# Patient Record
Sex: Male | Born: 1965 | Race: White | Hispanic: No | Marital: Single | Smoking: Heavy tobacco smoker
Health system: Southern US, Community
[De-identification: ages and names within clinical notes are randomized; demographics above are authoritative.]

## PROBLEM LIST (undated history)

## (undated) DIAGNOSIS — I10 Essential (primary) hypertension: Secondary | ICD-10-CM

## (undated) DIAGNOSIS — F32A Depression, unspecified: Secondary | ICD-10-CM

## (undated) DIAGNOSIS — F319 Bipolar disorder, unspecified: Secondary | ICD-10-CM

## (undated) DIAGNOSIS — S069XAA Unspecified intracranial injury with loss of consciousness status unknown, initial encounter: Secondary | ICD-10-CM

## (undated) DIAGNOSIS — F419 Anxiety disorder, unspecified: Secondary | ICD-10-CM

## (undated) DIAGNOSIS — F200 Paranoid schizophrenia: Secondary | ICD-10-CM

## (undated) DIAGNOSIS — F329 Major depressive disorder, single episode, unspecified: Secondary | ICD-10-CM

## (undated) DIAGNOSIS — F4310 Post-traumatic stress disorder, unspecified: Secondary | ICD-10-CM

## (undated) DIAGNOSIS — S069X9A Unspecified intracranial injury with loss of consciousness of unspecified duration, initial encounter: Secondary | ICD-10-CM

---

## 2008-11-25 ENCOUNTER — Emergency Department: Payer: Self-pay | Admitting: Emergency Medicine

## 2010-03-12 ENCOUNTER — Emergency Department: Payer: Self-pay

## 2012-08-28 ENCOUNTER — Emergency Department: Payer: Self-pay | Admitting: Emergency Medicine

## 2012-08-28 LAB — URINALYSIS, COMPLETE
Bilirubin,UR: NEGATIVE
Blood: NEGATIVE
Leukocyte Esterase: NEGATIVE
Nitrite: NEGATIVE
Ph: 5 (ref 4.5–8.0)
Protein: NEGATIVE
RBC,UR: 1 /HPF (ref 0–5)

## 2012-08-28 LAB — COMPREHENSIVE METABOLIC PANEL
Albumin: 4.2 g/dL (ref 3.4–5.0)
Alkaline Phosphatase: 109 U/L (ref 50–136)
BUN: 8 mg/dL (ref 7–18)
Chloride: 109 mmol/L — ABNORMAL HIGH (ref 98–107)
Co2: 25 mmol/L (ref 21–32)
Creatinine: 0.76 mg/dL (ref 0.60–1.30)
EGFR (African American): >60
EGFR (Non-African Amer.): >60
Glucose: 103 mg/dL — ABNORMAL HIGH (ref 65–99)
SGOT(AST): 59 U/L — ABNORMAL HIGH (ref 15–37)
SGPT (ALT): 36 U/L (ref 12–78)
Total Protein: 8.3 g/dL — ABNORMAL HIGH (ref 6.4–8.2)

## 2012-08-28 LAB — ETHANOL: Ethanol: 300 mg/dL

## 2012-08-28 LAB — CBC
HCT: 43.9 % (ref 40.0–52.0)
HGB: 14.5 g/dL (ref 13.0–18.0)
MCH: 32.1 pg (ref 26.0–34.0)
MCV: 97 fL (ref 80–100)
RBC: 4.51 10*6/uL (ref 4.40–5.90)
RDW: 14 % (ref 11.5–14.5)
WBC: 17.2 10*3/uL — ABNORMAL HIGH (ref 3.8–10.6)

## 2012-12-31 LAB — CBC
HGB: 16.3 g/dL (ref 13.0–18.0)
MCH: 33.4 pg (ref 26.0–34.0)
MCHC: 34.3 g/dL (ref 32.0–36.0)
MCV: 97 fL (ref 80–100)
Platelet: 346 10*3/uL (ref 150–440)
RBC: 4.88 10*6/uL (ref 4.40–5.90)
RDW: 13.3 % (ref 11.5–14.5)
WBC: 8.8 10*3/uL (ref 3.8–10.6)

## 2012-12-31 LAB — COMPREHENSIVE METABOLIC PANEL
Albumin: 4.2 g/dL (ref 3.4–5.0)
Creatinine: 1 mg/dL (ref 0.60–1.30)
EGFR (African American): >60
EGFR (Non-African Amer.): >60
Potassium: 3.9 mmol/L (ref 3.5–5.1)
SGPT (ALT): 48 U/L (ref 12–78)
Total Protein: 8.2 g/dL (ref 6.4–8.2)

## 2012-12-31 LAB — TSH: Thyroid Stimulating Horm: 1.46 u[IU]/mL

## 2012-12-31 LAB — ETHANOL
Ethanol %: 0.348 % (ref 0.000–0.080)
Ethanol: 348 mg/dL

## 2013-01-01 ENCOUNTER — Inpatient Hospital Stay: Payer: Self-pay | Admitting: Psychiatry

## 2013-01-01 LAB — DRUG SCREEN, URINE
Amphetamines, Ur Screen: NEGATIVE (ref ?–1000)
Barbiturates, Ur Screen: NEGATIVE (ref ?–200)
MDMA (Ecstasy)Ur Screen: NEGATIVE (ref ?–500)
Opiate, Ur Screen: NEGATIVE (ref ?–300)
Phencyclidine (PCP) Ur S: NEGATIVE (ref ?–25)
Tricyclic, Ur Screen: NEGATIVE (ref ?–1000)

## 2013-01-01 LAB — URINALYSIS, COMPLETE
Bacteria: NONE SEEN
Blood: NEGATIVE
Ketone: NEGATIVE
Leukocyte Esterase: NEGATIVE
Nitrite: NEGATIVE
Ph: 6 (ref 4.5–8.0)
Protein: NEGATIVE
Specific Gravity: 1.006 (ref 1.003–1.030)
Squamous Epithelial: <1

## 2013-04-08 ENCOUNTER — Emergency Department: Payer: Self-pay | Admitting: Emergency Medicine

## 2013-04-08 LAB — COMPREHENSIVE METABOLIC PANEL
Alkaline Phosphatase: 84 U/L (ref 50–136)
Bilirubin,Total: 0.6 mg/dL (ref 0.2–1.0)
Calcium, Total: 8.4 mg/dL — ABNORMAL LOW (ref 8.5–10.1)
Chloride: 102 mmol/L (ref 98–107)
Co2: 26 mmol/L (ref 21–32)
Creatinine: 0.93 mg/dL (ref 0.60–1.30)
EGFR (Non-African Amer.): >60
Glucose: 127 mg/dL — ABNORMAL HIGH (ref 65–99)
Potassium: 3.6 mmol/L (ref 3.5–5.1)
SGOT(AST): 58 U/L — ABNORMAL HIGH (ref 15–37)
SGPT (ALT): 30 U/L (ref 12–78)
Sodium: 135 mmol/L — ABNORMAL LOW (ref 136–145)

## 2013-04-08 LAB — CBC
HCT: 41.9 % (ref 40.0–52.0)
MCHC: 33.9 g/dL (ref 32.0–36.0)
MCV: 99 fL (ref 80–100)
Platelet: 284 10*3/uL (ref 150–440)
RBC: 4.22 10*6/uL — ABNORMAL LOW (ref 4.40–5.90)

## 2013-04-08 LAB — DRUG SCREEN, URINE
Amphetamines, Ur Screen: NEGATIVE (ref ?–1000)
Barbiturates, Ur Screen: NEGATIVE (ref ?–200)
Benzodiazepine, Ur Scrn: NEGATIVE (ref ?–200)
Cocaine Metabolite,Ur ~~LOC~~: NEGATIVE (ref ?–300)
Methadone, Ur Screen: NEGATIVE (ref ?–300)
Opiate, Ur Screen: POSITIVE (ref ?–300)
Phencyclidine (PCP) Ur S: NEGATIVE (ref ?–25)

## 2013-04-08 LAB — URINALYSIS, COMPLETE
Bacteria: NONE SEEN
Blood: NEGATIVE
Glucose,UR: NEGATIVE mg/dL (ref 0–75)
Ketone: NEGATIVE
Nitrite: NEGATIVE
Ph: 5 (ref 4.5–8.0)
RBC,UR: <1 /HPF (ref 0–5)
Specific Gravity: 1.005 (ref 1.003–1.030)
Squamous Epithelial: NONE SEEN

## 2013-04-08 LAB — ETHANOL: Ethanol: 234 mg/dL

## 2013-06-17 ENCOUNTER — Emergency Department: Payer: Self-pay | Admitting: Emergency Medicine

## 2013-06-17 LAB — COMPREHENSIVE METABOLIC PANEL
Albumin: 3.8 g/dL (ref 3.4–5.0)
Alkaline Phosphatase: 70 U/L (ref 50–136)
BUN: 12 mg/dL (ref 7–18)
Co2: 20 mmol/L — ABNORMAL LOW (ref 21–32)
Creatinine: 0.74 mg/dL (ref 0.60–1.30)
Glucose: 92 mg/dL (ref 65–99)
Potassium: 4.1 mmol/L (ref 3.5–5.1)
SGOT(AST): 42 U/L — ABNORMAL HIGH (ref 15–37)
SGPT (ALT): 29 U/L (ref 12–78)
Sodium: 137 mmol/L (ref 136–145)
Total Protein: 7.3 g/dL (ref 6.4–8.2)

## 2013-06-17 LAB — CBC
MCH: 34 pg (ref 26.0–34.0)
MCHC: 34.8 g/dL (ref 32.0–36.0)
MCV: 98 fL (ref 80–100)
Platelet: 372 10*3/uL (ref 150–440)

## 2013-06-17 LAB — ETHANOL
Ethanol %: 0.183 % — ABNORMAL HIGH (ref 0.000–0.080)
Ethanol: 183 mg/dL

## 2013-06-17 LAB — DRUG SCREEN, URINE
Barbiturates, Ur Screen: NEGATIVE (ref ?–200)
Cannabinoid 50 Ng, Ur ~~LOC~~: POSITIVE (ref ?–50)
MDMA (Ecstasy)Ur Screen: NEGATIVE (ref ?–500)
Methadone, Ur Screen: NEGATIVE (ref ?–300)
Phencyclidine (PCP) Ur S: NEGATIVE (ref ?–25)

## 2013-06-17 LAB — ACETAMINOPHEN LEVEL: Acetaminophen: <2 ug/mL

## 2014-04-05 ENCOUNTER — Emergency Department: Payer: Self-pay | Admitting: Emergency Medicine

## 2014-11-18 ENCOUNTER — Emergency Department: Payer: Self-pay | Admitting: Internal Medicine
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
## 2017-01-09 ENCOUNTER — Other Ambulatory Visit: Payer: Self-pay | Admitting: Obstetrics and Gynecology

## 2017-01-09 ENCOUNTER — Ambulatory Visit
Admission: RE | Admit: 2017-01-09 | Discharge: 2017-01-09 | Disposition: A | Discharge status: Home / Self Care | Payer: Disability Insurance | Source: Ambulatory Visit | Attending: Obstetrics and Gynecology | Admitting: Obstetrics and Gynecology

## 2017-01-09 DIAGNOSIS — S2239XA Fracture of one rib, unspecified side, initial encounter for closed fracture: Secondary | ICD-10-CM | POA: Insufficient documentation

## 2017-01-09 DIAGNOSIS — M75101 Unspecified rotator cuff tear or rupture of right shoulder, not specified as traumatic: Secondary | ICD-10-CM

## 2017-01-09 DIAGNOSIS — S27309A Unspecified injury of lung, unspecified, initial encounter: Secondary | ICD-10-CM | POA: Insufficient documentation

## 2017-01-09 DIAGNOSIS — M4186 Other forms of scoliosis, lumbar region: Secondary | ICD-10-CM | POA: Diagnosis not present

## 2017-01-09 DIAGNOSIS — I700 Atherosclerosis of aorta: Secondary | ICD-10-CM | POA: Insufficient documentation

## 2017-01-09 DIAGNOSIS — X58XXXA Exposure to other specified factors, initial encounter: Secondary | ICD-10-CM | POA: Insufficient documentation

## 2017-01-09 DIAGNOSIS — M5136 Other intervertebral disc degeneration, lumbar region: Secondary | ICD-10-CM | POA: Diagnosis not present

## 2017-11-13 ENCOUNTER — Encounter: Payer: Self-pay | Admitting: Emergency Medicine

## 2017-11-13 ENCOUNTER — Emergency Department
Admission: EM | Admit: 2017-11-13 | Discharge: 2017-11-14 | Disposition: A | Discharge status: Home / Self Care | Payer: Medicaid Other | Attending: Emergency Medicine | Admitting: Emergency Medicine

## 2017-11-13 ENCOUNTER — Other Ambulatory Visit: Payer: Self-pay

## 2017-11-13 DIAGNOSIS — F4310 Post-traumatic stress disorder, unspecified: Secondary | ICD-10-CM | POA: Diagnosis not present

## 2017-11-13 DIAGNOSIS — F419 Anxiety disorder, unspecified: Secondary | ICD-10-CM | POA: Insufficient documentation

## 2017-11-13 DIAGNOSIS — F329 Major depressive disorder, single episode, unspecified: Secondary | ICD-10-CM | POA: Diagnosis not present

## 2017-11-13 HISTORY — DX: Anxiety disorder, unspecified: F41.9

## 2017-11-13 HISTORY — DX: Depression, unspecified: F32.A

## 2017-11-13 HISTORY — DX: Major depressive disorder, single episode, unspecified: F32.9

## 2017-11-13 LAB — COMPREHENSIVE METABOLIC PANEL
ALT: 25 U/L (ref 17–63)
ANION GAP: 11 (ref 5–15)
AST: 37 U/L (ref 15–41)
Albumin: 4.8 g/dL (ref 3.5–5.0)
Alkaline Phosphatase: 61 U/L (ref 38–126)
BUN: 13 mg/dL (ref 6–20)
CHLORIDE: 107 mmol/L (ref 101–111)
CO2: 23 mmol/L (ref 22–32)
Calcium: 9.2 mg/dL (ref 8.9–10.3)
Creatinine, Ser: 0.83 mg/dL (ref 0.61–1.24)
Glucose, Bld: 94 mg/dL (ref 65–99)
POTASSIUM: 4.3 mmol/L (ref 3.5–5.1)
Sodium: 141 mmol/L (ref 135–145)
Total Bilirubin: 0.8 mg/dL (ref 0.3–1.2)
Total Protein: 8 g/dL (ref 6.5–8.1)

## 2017-11-13 LAB — CBC
HCT: 41.1 % (ref 40.0–52.0)
Hemoglobin: 14.1 g/dL (ref 13.0–18.0)
MCH: 34 pg (ref 26.0–34.0)
MCHC: 34.3 g/dL (ref 32.0–36.0)
MCV: 99.2 fL (ref 80.0–100.0)
PLATELETS: 406 10*3/uL (ref 150–440)
RBC: 4.14 MIL/uL — ABNORMAL LOW (ref 4.40–5.90)
RDW: 13.1 % (ref 11.5–14.5)
WBC: 6.1 10*3/uL (ref 3.8–10.6)

## 2017-11-13 LAB — URINE DRUG SCREEN, QUALITATIVE (ARMC ONLY)
AMPHETAMINES, UR SCREEN: NOT DETECTED
Barbiturates, Ur Screen: NOT DETECTED
Benzodiazepine, Ur Scrn: NOT DETECTED
Cannabinoid 50 Ng, Ur ~~LOC~~: POSITIVE — AB
Cocaine Metabolite,Ur ~~LOC~~: POSITIVE — AB
MDMA (ECSTASY) UR SCREEN: NOT DETECTED
Methadone Scn, Ur: NOT DETECTED
Opiate, Ur Screen: NOT DETECTED
Phencyclidine (PCP) Ur S: NOT DETECTED
TRICYCLIC, UR SCREEN: NOT DETECTED

## 2017-11-13 LAB — ACETAMINOPHEN LEVEL

## 2017-11-13 LAB — ETHANOL: ALCOHOL ETHYL (B): 65 mg/dL — AB (ref <10)

## 2017-11-13 LAB — SALICYLATE LEVEL

## 2017-11-13 NOTE — Discharge Instructions (Signed)
You have been seen in the emergency department for a  psychiatric concern. You have been evaluated both medically as well as psychiatrically. Please follow-up with your outpatient resources provided. Return to the emergency department for any worsening symptoms, or any thoughts of hurting yourself or anyone else so that we may attempt to help you. 
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
## 2017-11-13 NOTE — ED Notes (Signed)
Patient assigned to appropriate care area. Patient oriented to unit/care area: Informed that, for their safety, care areas are designed for safety and monitored by security cameras at all times; and visiting hours explained to patient. Patient verbalizes understanding, and verbal contract for safety obtained. 
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
## 2017-11-13 NOTE — ED Notes (Signed)
SOC MD called to get report. Report was given, consult is to be conducted.

## 2017-11-13 NOTE — ED Notes (Signed)
Pt was given a snack tray and a milk per request. Pt sitting in bed calm and cooperative with no needs from staff

## 2017-11-15 ENCOUNTER — Other Ambulatory Visit: Payer: Self-pay

## 2017-11-15 ENCOUNTER — Emergency Department
Admission: EM | Admit: 2017-11-15 | Discharge: 2017-11-15 | Disposition: A | Discharge status: Other Acute Inpatient Hospital | Payer: Medicaid Other | Attending: Emergency Medicine | Admitting: Emergency Medicine

## 2017-11-15 ENCOUNTER — Inpatient Hospital Stay
Admission: AD | Admit: 2017-11-15 | Discharge: 2017-11-20 | DRG: 882 | Disposition: A | Discharge status: Home / Self Care | Payer: No Typology Code available for payment source | Attending: Psychiatry | Admitting: Psychiatry

## 2017-11-15 ENCOUNTER — Encounter: Payer: Self-pay | Admitting: *Deleted

## 2017-11-15 DIAGNOSIS — F1420 Cocaine dependence, uncomplicated: Secondary | ICD-10-CM | POA: Diagnosis present

## 2017-11-15 DIAGNOSIS — Z560 Unemployment, unspecified: Secondary | ICD-10-CM

## 2017-11-15 DIAGNOSIS — R45850 Homicidal ideations: Secondary | ICD-10-CM | POA: Diagnosis not present

## 2017-11-15 DIAGNOSIS — R440 Auditory hallucinations: Secondary | ICD-10-CM | POA: Diagnosis present

## 2017-11-15 DIAGNOSIS — F4310 Post-traumatic stress disorder, unspecified: Secondary | ICD-10-CM

## 2017-11-15 DIAGNOSIS — F329 Major depressive disorder, single episode, unspecified: Secondary | ICD-10-CM | POA: Diagnosis present

## 2017-11-15 DIAGNOSIS — Z818 Family history of other mental and behavioral disorders: Secondary | ICD-10-CM

## 2017-11-15 DIAGNOSIS — F1010 Alcohol abuse, uncomplicated: Secondary | ICD-10-CM | POA: Diagnosis not present

## 2017-11-15 DIAGNOSIS — F1410 Cocaine abuse, uncomplicated: Secondary | ICD-10-CM | POA: Diagnosis not present

## 2017-11-15 DIAGNOSIS — F1210 Cannabis abuse, uncomplicated: Secondary | ICD-10-CM | POA: Diagnosis not present

## 2017-11-15 DIAGNOSIS — Z23 Encounter for immunization: Secondary | ICD-10-CM | POA: Diagnosis not present

## 2017-11-15 DIAGNOSIS — F1220 Cannabis dependence, uncomplicated: Secondary | ICD-10-CM

## 2017-11-15 DIAGNOSIS — F17210 Nicotine dependence, cigarettes, uncomplicated: Secondary | ICD-10-CM | POA: Diagnosis not present

## 2017-11-15 DIAGNOSIS — F1020 Alcohol dependence, uncomplicated: Secondary | ICD-10-CM | POA: Diagnosis present

## 2017-11-15 DIAGNOSIS — F17200 Nicotine dependence, unspecified, uncomplicated: Secondary | ICD-10-CM | POA: Diagnosis present

## 2017-11-15 LAB — URINE DRUG SCREEN, QUALITATIVE (ARMC ONLY)
Amphetamines, Ur Screen: NOT DETECTED
BARBITURATES, UR SCREEN: NOT DETECTED
Benzodiazepine, Ur Scrn: NOT DETECTED
COCAINE METABOLITE, UR ~~LOC~~: POSITIVE — AB
Cannabinoid 50 Ng, Ur ~~LOC~~: POSITIVE — AB
MDMA (Ecstasy)Ur Screen: NOT DETECTED
METHADONE SCREEN, URINE: NOT DETECTED
Opiate, Ur Screen: NOT DETECTED
PHENCYCLIDINE (PCP) UR S: NOT DETECTED
Tricyclic, Ur Screen: NOT DETECTED

## 2017-11-15 LAB — COMPREHENSIVE METABOLIC PANEL
ALBUMIN: 4.9 g/dL (ref 3.5–5.0)
ALK PHOS: 68 U/L (ref 38–126)
ALT: 26 U/L (ref 17–63)
ANION GAP: 9 (ref 5–15)
AST: 41 U/L (ref 15–41)
BILIRUBIN TOTAL: 1.1 mg/dL (ref 0.3–1.2)
BUN: 16 mg/dL (ref 6–20)
CALCIUM: 9.5 mg/dL (ref 8.9–10.3)
CO2: 27 mmol/L (ref 22–32)
CREATININE: 0.94 mg/dL (ref 0.61–1.24)
Chloride: 103 mmol/L (ref 101–111)
GFR calc Af Amer: >60 mL/min (ref >60)
GFR calc non Af Amer: >60 mL/min (ref >60)
GLUCOSE: 96 mg/dL (ref 65–99)
Potassium: 4.9 mmol/L (ref 3.5–5.1)
SODIUM: 139 mmol/L (ref 135–145)
TOTAL PROTEIN: 8.5 g/dL — AB (ref 6.5–8.1)

## 2017-11-15 LAB — CBC
HCT: 42.7 % (ref 40.0–52.0)
HEMOGLOBIN: 14.5 g/dL (ref 13.0–18.0)
MCH: 34.1 pg — ABNORMAL HIGH (ref 26.0–34.0)
MCHC: 34.1 g/dL (ref 32.0–36.0)
MCV: 100.1 fL — ABNORMAL HIGH (ref 80.0–100.0)
Platelets: 437 10*3/uL (ref 150–440)
RBC: 4.27 MIL/uL — ABNORMAL LOW (ref 4.40–5.90)
RDW: 13.1 % (ref 11.5–14.5)
WBC: 8.2 10*3/uL (ref 3.8–10.6)

## 2017-11-15 LAB — ACETAMINOPHEN LEVEL

## 2017-11-15 LAB — ETHANOL: Alcohol, Ethyl (B): <10 mg/dL (ref <10)

## 2017-11-15 LAB — SALICYLATE LEVEL: Salicylate Lvl: <7 mg/dL (ref 2.8–30.0)

## 2017-11-15 MED ORDER — FLUOXETINE HCL 20 MG PO CAPS
20.0000 mg | ORAL_CAPSULE | Freq: Every day | ORAL | Status: DC
  Administered 2017-11-15: 20 mg via ORAL
  Filled 2017-11-15: qty 1

## 2017-11-15 MED ORDER — HYDROXYZINE HCL 25 MG PO TABS
50.0000 mg | ORAL_TABLET | Freq: Three times a day (TID) | ORAL | Status: DC | PRN
  Administered 2017-11-15: 50 mg via ORAL
  Filled 2017-11-15: qty 2

## 2017-11-15 MED ORDER — QUETIAPINE FUMARATE 25 MG PO TABS
100.0000 mg | ORAL_TABLET | Freq: Every day | ORAL | Status: DC
  Administered 2017-11-15: 100 mg via ORAL
  Filled 2017-11-15: qty 4
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
## 2017-11-15 NOTE — ED Notes (Signed)
Pt has wallet in pants pocket. No reported cash, belt, jeans, shirt, jacket, black shoes, socks, underwear, long johns and a black hat all placed in belongings bag with pt present. Bag taken to [facility] with pt.

## 2017-11-15 NOTE — ED Notes (Signed)
[facility] Patient agreeable with plan of care.

## 2017-11-15 NOTE — ED Notes (Signed)
Patient denied snack.

## 2017-11-15 NOTE — ED Notes (Signed)
Pt given supper tray.

## 2017-11-15 NOTE — ED Notes (Signed)
Pt. To BHU from ED ambulatory without difficulty, to room  [facility]. [facility]. Pt. Is alert and oriented, warm and dry in no distress. Pt. Denies SI. Pt states, "I am here because of homicidal thoughts and I am hearing voices saying they going to get me and I see shadows."  Pt. Calm and cooperative. Pt. Made aware of security cameras and Q15 minute rounds. Patient was given a sandwich tray and soda. Pt. Encouraged to let Nursing staff know of any concerns or needs.
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
## 2017-11-15 NOTE — ED Notes (Signed)

## 2017-11-15 NOTE — BH Assessment (Signed)
Contacted [facility] BMU re: placing pt on the unit. Staff Alex stated 3 other pts are being admitted on the unit and they are currently unsure if they would be unable to accept another pt; stated they would contact me later with an update.

## 2017-11-15 NOTE — ED Notes (Signed)
Pt. Currently sleeping in bed.  TTS and Pych consult completed.  SOC recommends inpatient treatment.

## 2017-11-16 ENCOUNTER — Other Ambulatory Visit: Payer: Self-pay

## 2017-11-16 DIAGNOSIS — F17200 Nicotine dependence, unspecified, uncomplicated: Secondary | ICD-10-CM | POA: Diagnosis present

## 2017-11-16 LAB — HEMOGLOBIN A1C
Hgb A1c MFr Bld: 5.5 % (ref 4.8–5.6)
MEAN PLASMA GLUCOSE: 111.15 mg/dL

## 2017-11-16 LAB — LIPID PANEL
CHOL/HDL RATIO: 3.8 ratio
CHOLESTEROL: 178 mg/dL (ref 0–200)
HDL: 47 mg/dL (ref >40)
LDL Cholesterol: 91 mg/dL (ref 0–99)
Triglycerides: 200 mg/dL — ABNORMAL HIGH (ref <150)
VLDL: 40 mg/dL (ref 0–40)

## 2017-11-16 LAB — TSH: TSH: 1.517 u[IU]/mL (ref 0.350–4.500)

## 2017-11-16 MED ORDER — FLUOXETINE HCL 20 MG PO CAPS
20.0000 mg | ORAL_CAPSULE | Freq: Every day | ORAL | Status: DC
  Administered 2017-11-16 – 2017-11-19 (×4): 20 mg via ORAL
  Filled 2017-11-16 (×4): qty 1

## 2017-11-16 MED ORDER — ALUM & MAG HYDROXIDE-SIMETH 200-200-20 MG/5ML PO SUSP: 30.0000 mL | ORAL | Status: DC | PRN

## 2017-11-16 MED ORDER — PRAZOSIN HCL 2 MG PO CAPS
2.0000 mg | ORAL_CAPSULE | Freq: Two times a day (BID) | ORAL | Status: DC
  Administered 2017-11-17 – 2017-11-20 (×7): 2 mg via ORAL
  Filled 2017-11-16 (×7): qty 1

## 2017-11-16 MED ORDER — QUETIAPINE FUMARATE 200 MG PO TABS
300.0000 mg | ORAL_TABLET | Freq: Every day | ORAL | Status: DC
  Administered 2017-11-16 – 2017-11-19 (×4): 300 mg via ORAL
  Filled 2017-11-16 (×3): qty 1

## 2017-11-16 MED ORDER — ACETAMINOPHEN 325 MG PO TABS
650.0000 mg | ORAL_TABLET | Freq: Four times a day (QID) | ORAL | Status: DC | PRN
  Administered 2017-11-16 – 2017-11-20 (×6): 650 mg via ORAL
  Filled 2017-11-16 (×7): qty 2

## 2017-11-16 MED ORDER — TRAZODONE HCL 100 MG PO TABS
100.0000 mg | ORAL_TABLET | Freq: Every evening | ORAL | Status: DC | PRN
  Administered 2017-11-16: 100 mg via ORAL
  Filled 2017-11-16: qty 1

## 2017-11-16 MED ORDER — MAGNESIUM HYDROXIDE 400 MG/5ML PO SUSP: 30.0000 mL | Freq: Every day | ORAL | Status: DC | PRN

## 2017-11-16 MED ORDER — INFLUENZA VAC SPLIT QUAD 0.5 ML IM SUSY
0.5000 mL | PREFILLED_SYRINGE | INTRAMUSCULAR | Status: AC
  Administered 2017-11-17: 0.5 mL via INTRAMUSCULAR
  Filled 2017-11-16: qty 0.5

## 2017-11-16 MED ORDER — QUETIAPINE FUMARATE 100 MG PO TABS: 100.0000 mg | ORAL_TABLET | Freq: Every day | ORAL | Status: DC

## 2017-11-16 MED ORDER — PNEUMOCOCCAL VAC POLYVALENT 25 MCG/0.5ML IJ INJ
0.5000 mL | INJECTION | INTRAMUSCULAR | Status: AC
  Administered 2017-11-17: 0.5 mL via INTRAMUSCULAR
  Filled 2017-11-16: qty 0.5

## 2017-11-16 MED ORDER — HYDROXYZINE HCL 50 MG PO TABS
50.0000 mg | ORAL_TABLET | Freq: Three times a day (TID) | ORAL | Status: DC | PRN
  Administered 2017-11-16 – 2017-11-19 (×2): 50 mg via ORAL
  Filled 2017-11-16 (×2): qty 1

## 2017-11-16 MED ORDER — ADULT MULTIVITAMIN W/MINERALS CH
1.0000 | ORAL_TABLET | Freq: Every day | ORAL | Status: DC
  Administered 2017-11-16 – 2017-11-20 (×5): 1 via ORAL
  Filled 2017-11-16 (×5): qty 1

## 2017-11-16 MED ORDER — ENSURE ENLIVE PO LIQD
237.0000 mL | Freq: Three times a day (TID) | ORAL | Status: DC
  Administered 2017-11-16 – 2017-11-20 (×13): 237 mL via ORAL

## 2017-11-16 NOTE — Tx Team (Signed)
Initial Treatment Plan 11/16/2017 1:51 AM Bill Villanueva ZOX:[id number]    PATIENT STRESSORS: Health problems Medication change or noncompliance Substance abuse   PATIENT STRENGTHS: Ability for insight Capable of independent living Motivation for treatment/growth Special hobby/interest   PATIENT IDENTIFIED PROBLEMS:   To be free from anxiety and depression   " To start taking my medications everyday"                 DISCHARGE CRITERIA:  Ability to meet basic life and health needs Adequate post-discharge living arrangements Improved stabilization in mood, thinking, and/or behavior Motivation to continue treatment in a less acute level of care Reduction of life-threatening or endangering symptoms to within safe limits Verbal commitment to aftercare and medication compliance  PRELIMINARY DISCHARGE PLAN: Attend aftercare/continuing care group Outpatient therapy Return to previous living arrangement  PATIENT/FAMILY INVOLVEMENT: This treatment plan has been presented to and reviewed with the patient, Bill Villanueva, and/or family member,  The patient and family have been given the opportunity to ask questions and make suggestions.  Bill Oxfordharlotte Norvella Loscalzo, RN 11/16/2017, 1:51 AM
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
## 2017-11-16 NOTE — Progress Notes (Signed)
Patient slept 4 hours this shift, no acute distress noted.
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
## 2017-11-16 NOTE — Progress Notes (Signed)
The patient is admitted to room 316 with the diagnosis of PTSD. Alert and oriented x 4. The  patient is oriented to his room, staff and the unit. Patient verbalized he's depressed and anxious but denies SI/HI/AVH. Skin assessment done with Kathlene NovemberMike RN, noted abrasion on right forearm and multiple  Scattered tattoos. During admission questionnaire, he stated he's been off from his medication for so long and felt that triggered his emotions. He verbalized he's ready to be complaint with his medications. Tylenol 650 mg PRN administered for back pain,  Vistaril 50 mg PRN for anxiety, and Trazodone 100 mg for insomnia. He was offered snack before going to bed. Will continue Q 15 minutes checks and camera surveillance.

## 2017-11-16 NOTE — [facility] Suicide Risk Assessment (Signed)
[facility] INPATIENT:  Family/Significant Other Suicide Prevention Education  Suicide Prevention Education:  Patient Refusal for Family/Significant Other Suicide Prevention Education: The patient Blanca FriendLarry W Lucchesi has refused to provide written consent for family/significant other to be provided Family/Significant Other Suicide Prevention Education during admission and/or prior to discharge.  Physician notified.  Heidi DachKelsey Arlie Posch, LCSW 11/16/2017, 10:43 AM

## 2017-11-16 NOTE — Plan of Care (Signed)
  Progressing Nutrition: Adequate nutrition will be maintained 11/16/2017 0952 - Progressing by Elige Radonobb, Kendrik Mcshan B, RN Coping: Level of anxiety will decrease 11/16/2017 0952 - Progressing by Elige Radonobb, Birdia Jaycox B, RN Safety: Ability to remain free from injury will improve 11/16/2017 0952 - Progressing by Elige Radonobb, Raylinn Kosar B, RN Education: Knowledge of the prescribed therapeutic regimen will improve 11/16/2017 0952 - Progressing by Elige Radonobb, Balen Woolum B, RN Coping: Ability to cope will improve 11/16/2017 0952 - Progressing by Elige Radonobb, Shlome Baldree B, RN Ability to verbalize feelings will improve 11/16/2017 0952 - Progressing by Elige Radonobb, Antwyne Pingree B, RN Safety: Ability to disclose and discuss suicidal ideas will improve 11/16/2017 0952 - Progressing by Elige Radonobb, Jamelah Sitzer B, RN   Not Progressing Activity: Interest or engagement in leisure activities will improve 11/16/2017 0952 - Not Progressing by Elige Radonobb, Lowanda Cashaw B, RN   Not Applicable Clinical Measurements: Ability to maintain clinical measurements within normal limits will improve 11/16/2017 0952 - Not Applicable by Elige Radonobb, Paetyn Pietrzak B, RN Will remain free from infection 11/16/2017 [id number] - Not Applicable by Elige Radonobb, Aemon Koeller B, RN Diagnostic test results will improve 11/16/2017 [id number] - Not Applicable by Elige Radonobb, Lawernce Earll B, RN Respiratory complications will improve 11/16/2017 [id number] - Not Applicable by Elige Radonobb, Jamell Opfer B, RN Cardiovascular complication will be avoided 11/16/2017 [id number] - Not Applicable by Elige Radonobb, Wilhelmine Krogstad B, RN Skin Integrity: Risk for impaired skin integrity will decrease 11/16/2017 0952 - Not Applicable by Elige Radonobb, Ricki Vanhandel B, RN

## 2017-11-16 NOTE — Progress Notes (Signed)
Initial Nutrition Assessment  DOCUMENTATION CODES:   Underweight  INTERVENTION:   Ensure Enlive po TID, each supplement provides 350 kcal and 20 grams of protein  MVI daily   Recommend thiamine 100mg  po daily  Recommend folic acid 1mg  daily   NUTRITION DIAGNOSIS:   Increased nutrient needs related to social / environmental circumstances(substance abuse) as evidenced by increased estimated needs from protein, folic acid, and thiamine.  GOAL:   Patient will meet greater than or equal to 90% of their needs  MONITOR:   PO intake, Supplement acceptance  REASON FOR ASSESSMENT:   Malnutrition Screening Tool    ASSESSMENT:   52 year old man who was brought to the emergency room referred by [facility] today because of agitation and "homicidal ideation"   Pt with h/o etoh and drug abuse. Pt is currently eating 100% of meals. Per chart, pt is weight stable. RD suspects this pt may have some degree of malnutrition giving his substance abuse and pt is underweight. RD will order supplements and MVI. Recommend thiamine and folic acid r/t etoh abuse.   Medications reviewed  Labs reviewed  Diet Order:  Diet regular Room service appropriate? Yes; Fluid consistency: Thin  EDUCATION NEEDS:   Not appropriate for education at this time  Skin:  Reviewed RN Assessment  Last BM:  2/19  Height:   Ht Readings from Last 1 Encounters:  11/15/17 5\' 5"  (1.651 m)    Weight:   Wt Readings from Last 1 Encounters:  11/15/17 125 lb 8 oz (56.9 kg)    Ideal Body Weight:  61.8 kg  BMI:  Body mass index is 20.88 kg/m.  Estimated Nutritional Needs:   Kcal:  1700-2000kcal/day   Protein:  85-96g/day   Fluid:  >1.7L/day   Betsey Holidayasey Alese Furniss MS, RD, LDN Pager #[phone]- [phone] After Hours Pager: [phone]

## 2017-11-16 NOTE — [facility] Group Notes (Signed)
  11/16/2017 9am  Type of Therapy and Topic: Group Therapy: Goals Group: SMART Goals   Participation Level: Did Not Attend  Description of Group:    The purpose of a daily goals group is to assist and guide patients in setting recovery/wellness-related goals. The objective is to set goals as they relate to the crisis in which they were admitted. Patients will be using SMART goal modalities to set measurable goals. Characteristics of realistic goals will be discussed and patients will be assisted in setting and processing how one will reach their goal. Facilitator will also assist patients in applying interventions and coping skills learned in psycho-education groups to the SMART goal and process how one will achieve defined goal.   Therapeutic Goals:   -Patients will develop and document one goal related to or their crisis in which brought them into treatment.  -Patients will be guided by LCSW using SMART goal setting modality in how to set a measurable, attainable, realistic and time sensitive goal.  -Patients will process barriers in reaching goal.  -Patients will process interventions in how to overcome and successful in reaching goal.   Patient's Goal: Patient was encouraged and invited to attend group. Patient did not attend group. Social worker will continue to encourage group participation in the future.    Therapeutic Modalities:  Motivational Interviewing  Cognitive Behavioral Therapy  Crisis Intervention Model  SMART goals setting  Jacobus Colvin, LCSW 11/16/2017 9:43 AM   

## 2017-11-16 NOTE — [facility] Group Notes (Signed)
11/16/2017  Time: 1:00PM  Type of Therapy/Topic:  Group Therapy:  Balance in Life  Participation Level:  Did Not Attend  Description of Group:   This group will address the concept of balance and how it feels and looks when one is unbalanced. Patients will be encouraged to process areas in their lives that are out of balance and identify reasons for remaining unbalanced. Facilitators will guide patients in utilizing problem-solving interventions to address and correct the stressor making their life unbalanced. Understanding and applying boundaries will be explored and addressed for obtaining and maintaining a balanced life. Patients will be encouraged to explore ways to assertively make their unbalanced needs known to significant others in their lives, using other group members and facilitator for support and feedback.  Therapeutic Goals: 1. Patient will identify two or more emotions or situations they have that consume much of in their lives. 2. Patient will identify signs/triggers that life has become out of balance:  3. Patient will identify two ways to set boundaries in order to achieve balance in their lives:  4. Patient will demonstrate ability to communicate their needs through discussion and/or role plays  Summary of Patient Progress: Pt was invited to attend group but chose not to attend. CSW will continue to encourage pt to attend group throughout their admission.   Therapeutic Modalities:   Cognitive Behavioral Therapy Solution-Focused Therapy Assertiveness Training  Heidi DachKelsey Makaylia Hewett, MSW, LCSW 11/16/2017 1:55 PM

## 2017-11-16 NOTE — Progress Notes (Signed)
Patient affect constricted.  Verbalizes that he is starting to feel better.  Up for meals otherwise isolates to room.  Good appetite.  Medication compliant.  No group attendance.  Support and encouragement offered.  Safety rounds maintained.  Patient remains safe on the unit.
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
## 2017-11-17 MED ORDER — MEGESTROL ACETATE 400 MG/10ML PO SUSP
400.0000 mg | Freq: Every day | ORAL | Status: DC
  Administered 2017-11-17 – 2017-11-20 (×4): 400 mg via ORAL
  Filled 2017-11-17 (×4): qty 10

## 2017-11-17 MED ORDER — VITAMIN B-1 100 MG PO TABS
100.0000 mg | ORAL_TABLET | Freq: Every day | ORAL | Status: DC
  Administered 2017-11-17 – 2017-11-20 (×4): 100 mg via ORAL
  Filled 2017-11-17 (×4): qty 1

## 2017-11-17 MED ORDER — FOLIC ACID 1 MG PO TABS
1.0000 mg | ORAL_TABLET | Freq: Every day | ORAL | Status: DC
  Administered 2017-11-17 – 2017-11-20 (×4): 1 mg via ORAL
  Filled 2017-11-17 (×4): qty 1

## 2017-11-17 NOTE — Progress Notes (Signed)
D: Patient denies SI/HI/AVH. P Patient affect is sad. Mood is depressed.  Patient interaction with staff was minimal.  Patient guarded and forwards little. Patient visible on the milieu. No distress noted. A: Support and encouragement offered. Scheduled medications given to pt. Q 15 min checks continued for patient safety. R: Patient receptive. Patient remains safe on the unit.

## 2017-11-17 NOTE — Progress Notes (Signed)
CSW completed medication management application for pt in order for it to be faxed, with prescriptions, on Monday prior to pt's discharge. CSW will continue to support pt as needed throughout his admission.   Heidi DachKelsey Anagabriela Jokerst, MSW, LCSW 11/17/2017 12:21 PM

## 2017-11-17 NOTE — Tx Team (Signed)
Interdisciplinary Treatment and Diagnostic Plan Update  11/17/2017 Time of Session: [address] Bill Villanueva MRN: [id number]  Principal Diagnosis: PTSD (post-traumatic stress disorder)  Secondary Diagnoses: Principal Problem:   PTSD (post-traumatic stress disorder) Active Problems:   Cocaine use disorder, moderate, dependence (HCC)   Cannabis use disorder, moderate, dependence (HCC)   Tobacco use disorder   Current Medications:  Current Facility-Administered Medications  Medication Dose Route Frequency Provider Last Rate Last Dose  . acetaminophen (TYLENOL) tablet 650 mg  650 mg Oral Q6H PRN Villanueva, Bill Denmark, MD   650 mg at 11/16/17 1841  . alum & mag hydroxide-simeth (MAALOX/MYLANTA) 200-200-20 MG/5ML suspension 30 mL  30 mL Oral Q4H PRN Villanueva, Bill T, MD      . feeding supplement (ENSURE ENLIVE) (ENSURE ENLIVE) liquid 237 mL  237 mL Oral TID BM Villanueva, Bill Hutchinson, MD   237 mL at 11/16/17 2000  . FLUoxetine (PROZAC) capsule 20 mg  20 mg Oral Daily Villanueva, Bill Denmark, MD   20 mg at 11/17/17 0845  . folic acid (FOLVITE) tablet 1 mg  1 mg Oral Daily Villanueva, Bill B, MD   1 mg at 11/17/17 0845  . hydrOXYzine (ATARAX/VISTARIL) tablet 50 mg  50 mg Oral TID PRN Villanueva, Bill Denmark, MD   50 mg at 11/16/17 0054  . Influenza vac split quadrivalent PF (FLUARIX) injection 0.5 mL  0.5 mL Intramuscular Tomorrow-1000 Villanueva, Bill R, MD      . magnesium hydroxide (MILK OF MAGNESIA) suspension 30 mL  30 mL Oral Daily PRN Villanueva, Bill T, MD      . multivitamin with minerals tablet 1 tablet  1 tablet Oral Daily Villanueva, Bill Hutchinson, MD   1 tablet at 11/17/17 0844  . pneumococcal 23 valent vaccine (PNU-IMMUNE) injection 0.5 mL  0.5 mL Intramuscular Tomorrow-1000 Villanueva, Bill R, MD      . prazosin (MINIPRESS) capsule 2 mg  2 mg Oral BID Villanueva, Bill B, MD   2 mg at 11/17/17 0845  . QUEtiapine (SEROQUEL) tablet 300 mg  300 mg Oral QHS Villanueva, Bill B, MD   300 mg at 11/16/17 2104  . thiamine (VITAMIN B-1)  tablet 100 mg  100 mg Oral Daily Villanueva, Bill B, MD   100 mg at 11/17/17 0845   PTA Medications: No medications prior to admission.    Patient Stressors: Health problems Medication change or noncompliance Substance abuse  Patient Strengths: Ability for insight Capable of independent living Motivation for treatment/growth Special hobby/interest  Treatment Modalities: Medication Management, Group therapy, Case management,  1 to 1 session with clinician, Psychoeducation, Recreational therapy.   Physician Treatment Plan for Primary Diagnosis: PTSD (post-traumatic stress disorder) Long Term Goal(s): Improvement in symptoms so as ready for discharge Improvement in symptoms so as ready for discharge   Short Term Goals: Ability to identify changes in lifestyle to reduce recurrence of condition will improve Ability to verbalize feelings will improve Ability to disclose and discuss suicidal ideas Ability to demonstrate self-control will improve Ability to identify and develop effective coping behaviors will improve Ability to maintain clinical measurements within normal limits will improve Compliance with prescribed medications will improve Ability to identify triggers associated with substance abuse/mental health issues will improve Ability to identify changes in lifestyle to reduce recurrence of condition will improve Ability to demonstrate self-control will improve Ability to identify triggers associated with substance abuse/mental health issues will improve  Medication Management: Evaluate patient's response, side effects, and tolerance of medication regimen.  Therapeutic Interventions:  1 to 1 sessions, Unit Group sessions and Medication administration.  Evaluation of Outcomes: Progressing  Physician Treatment Plan for Secondary Diagnosis: Principal Problem:   PTSD (post-traumatic stress disorder) Active Problems:   Cocaine use disorder, moderate, dependence (HCC)    Cannabis use disorder, moderate, dependence (HCC)   Tobacco use disorder  Long Term Goal(s): Improvement in symptoms so as ready for discharge Improvement in symptoms so as ready for discharge   Short Term Goals: Ability to identify changes in lifestyle to reduce recurrence of condition will improve Ability to verbalize feelings will improve Ability to disclose and discuss suicidal ideas Ability to demonstrate self-control will improve Ability to identify and develop effective coping behaviors will improve Ability to maintain clinical measurements within normal limits will improve Compliance with prescribed medications will improve Ability to identify triggers associated with substance abuse/mental health issues will improve Ability to identify changes in lifestyle to reduce recurrence of condition will improve Ability to demonstrate self-control will improve Ability to identify triggers associated with substance abuse/mental health issues will improve     Medication Management: Evaluate patient's response, side effects, and tolerance of medication regimen.  Therapeutic Interventions: 1 to 1 sessions, Unit Group sessions and Medication administration.  Evaluation of Outcomes: Progressing   RN Treatment Plan for Primary Diagnosis: PTSD (post-traumatic stress disorder) Long Term Goal(s): Knowledge of disease and therapeutic regimen to maintain health will improve  Short Term Goals: Ability to verbalize feelings will improve, Ability to identify and develop effective coping behaviors will improve and Compliance with prescribed medications will improve  Medication Management: RN will administer medications as ordered by provider, will assess and evaluate patient's response and provide education to patient for prescribed medication. RN will report any adverse and/or side effects to prescribing provider.  Therapeutic Interventions: 1 on 1 counseling sessions, Psychoeducation, Medication  administration, Evaluate responses to treatment, Monitor vital signs and CBGs as ordered, Perform/monitor CIWA, COWS, AIMS and Fall Risk screenings as ordered, Perform wound care treatments as ordered.  Evaluation of Outcomes: Progressing   LCSW Treatment Plan for Primary Diagnosis: PTSD (post-traumatic stress disorder) Long Term Goal(s): Safe transition to appropriate next level of care at discharge, Engage patient in therapeutic group addressing interpersonal concerns.  Short Term Goals: Engage patient in aftercare planning with referrals and resources, Facilitate patient progression through stages of change regarding substance use diagnoses and concerns and Identify triggers associated with mental health/substance abuse issues  Therapeutic Interventions: Assess for all discharge needs, 1 to 1 time with Social worker, Explore available resources and support systems, Assess for adequacy in community support network, Educate family and significant other(s) on suicide prevention, Complete Psychosocial Assessment, Interpersonal group therapy.  Evaluation of Outcomes: Progressing   Progress in Treatment: Attending groups: Yes. Participating in groups: Yes. Taking medication as prescribed: Yes. Toleration medication: Yes. Family/Significant other contact made: No, will contact:  a support if pt provides consent. Patient understands diagnosis: Yes. Discussing patient identified problems/goals with staff: Yes. Medical problems stabilized or resolved: Yes. Denies suicidal/homicidal ideation: Yes. Issues/concerns per patient self-inventory: No. Other: None at this time.   New problem(s) identified: No, Describe:  None at this time.   New Short Term/Long Term Goal(s): Pt reported his goal for treatment is to, "get back on my medications so I can do what I need to do."   Discharge Plan or Barriers: Pt will discharge home and will continue treatment in the outpatient setting with [facility].   Reason  for Continuation of Hospitalization: Anxiety Depression Medication stabilization  Estimated Length of Stay: 3 days  Attendees: Patient: Bill Villanueva GeneralLarry Dinning 11/17/2017 11:10 AM  Physician: Dr. Jennet MaduroPucilowska, MD 11/17/2017 11:10 AM  Nursing:  11/17/2017 11:10 AM  RN Care Manager: 11/17/2017 11:10 AM  Social Worker: Heidi DachKelsey Janna Oak, LCSW 11/17/2017 11:10 AM  Recreational Therapist:  11/17/2017 11:10 AM  Other: Johny Shearsassandra Jarrett, LCSWA 11/17/2017 11:10 AM  Other:  11/17/2017 11:10 AM  Other: 11/17/2017 11:10 AM    Scribe for Treatment Team: Heidi DachKelsey Wiley Magan, LCSW 11/17/2017 11:10 AM

## 2017-11-17 NOTE — Plan of Care (Signed)
  Progressing Health Behavior/Discharge Planning: Ability to manage health-related needs will improve 11/17/2017 2013 - Progressing by Lelan PonsAriwodo, Bekah Igoe, RN Activity: Risk for activity intolerance will decrease 11/17/2017 2013 - Progressing by Lelan PonsAriwodo, Kathryn Cosby, RN Nutrition: Adequate nutrition will be maintained 11/17/2017 2013 - Progressing by Lelan PonsAriwodo, Gearlene Godsil, RN Coping: Level of anxiety will decrease 11/17/2017 2013 - Progressing by Lelan PonsAriwodo, Nancie Bocanegra, RN Pain Managment: General experience of comfort will improve 11/17/2017 2013 - Progressing by Lelan PonsAriwodo, Bryttani Blew, RN Safety: Ability to remain free from injury will improve 11/17/2017 2013 - Progressing by Lelan PonsAriwodo, Mickey Esguerra, RN Coping: Ability to verbalize feelings will improve 11/17/2017 2013 - Progressing by Lelan PonsAriwodo, Khristine Verno, RN

## 2017-11-17 NOTE — [facility] Group Notes (Signed)
LCSW Group Therapy Note  11/17/2017 1:00 pm  Type of Therapy and Topic:  Group Therapy:  Feelings around Relapse and Recovery  Participation Level:  Did Not Attend   Description of Group:    Patients in this group will discuss emotions they experience before and after a relapse. They will process how experiencing these feelings, or avoidance of experiencing them, relates to having a relapse. Facilitator will guide patients to explore emotions they have related to recovery. Patients will be encouraged to process which emotions are more powerful. They will be guided to discuss the emotional reaction significant others in their lives may have to their relapse or recovery. Patients will be assisted in exploring ways to respond to the emotions of others without this contributing to a relapse.  Therapeutic Goals: 1. Patient will identify two or more emotions that lead to a relapse for them 2. Patient will identify two emotions that result when they relapse 3. Patient will identify two emotions related to recovery 4. Patient will demonstrate ability to communicate their needs through discussion and/or role plays   Summary of Patient Progress:     Therapeutic Modalities:   Cognitive Behavioral Therapy Solution-Focused Therapy Assertiveness Training Relapse Prevention Therapy   Bill FrameSonya S Piercen Covino, LCSW 11/17/2017 3:16 PM

## 2017-11-17 NOTE — Plan of Care (Signed)
Patient is alert and oriented X 4. Patient denies SI and HI. Patient states his voices has not been bothering him in a few days. Patient received his influenza injection as well as pneumonia injection. Patient is eating meals regularly and is pleasant to staff and peers. Patient has no concerns or complaints today. Vitals  140/82; respiration 16; Temperature: 97.8. Nurse will continue to monitor. Education: Knowledge of General Education information will improve 11/17/2017 1311 - Progressing by Leamon ArntPowell, Gram Siedlecki K, RN   Health Behavior/Discharge Planning: Ability to manage health-related needs will improve 11/17/2017 1311 - Progressing by Leamon ArntPowell, Kade Demicco K, RN   Activity: Risk for activity intolerance will decrease 11/17/2017 1311 - Progressing by Leamon ArntPowell, Brisha Mccabe K, RN   Nutrition: Adequate nutrition will be maintained 11/17/2017 1311 - Progressing by Leamon ArntPowell, Palmina Clodfelter K, RN   Coping: Level of anxiety will decrease 11/17/2017 1311 - Progressing by Leamon ArntPowell, Ilayda Toda K, RN   Elimination: Will not experience complications related to bowel motility 11/17/2017 1311 - Progressing by Leamon ArntPowell, Solana Coggin K, RN Will not experience complications related to urinary retention 11/17/2017 1311 - Progressing by Leamon ArntPowell, Aliana Kreischer K, RN
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
## 2017-11-18 NOTE — Progress Notes (Signed)
Patient is sleeping long hours with out any disturbances contract for safety of self and others. Denies any SI/HI, AVH at this time , encourage patient to participate more in activities with peers, appetite has increased moderately as patient is consuming more than 50% of meals, no distress noted.

## 2017-11-18 NOTE — Progress Notes (Signed)
Patient is alert and oriented x 4. Remains isolative to room except when needed for medication and meals. Denies SI/HI/AVH but endorses some generalized pain. PRN Tylenol administered with some effectiveness. Patient ate 100% of breakfast this am, states, "That medicine y'all are giving me is giving me my appetite back." Affect pleasant and cooperative. Milieu remains safe with q 15 minute.

## 2017-11-18 NOTE — [facility] Group Notes (Signed)
LCSW Group Therapy Note   11/18/2017 1:15pm   Type of Therapy and Topic:  Group Therapy:  Trust and Honesty  Participation Level:  Did Not Attend  Description of Group:    In this group patients will be asked to explore the value of being honest.  Patients will be guided to discuss their thoughts, feelings, and behaviors related to honesty and trusting in others. Patients will process together how trust and honesty relate to forming relationships with peers, family members, and self. Each patient will be challenged to identify and express feelings of being vulnerable. Patients will discuss reasons why people are dishonest and identify alternative outcomes if one was truthful (to self or others). This group will be process-oriented, with patients participating in exploration of their own experiences, giving and receiving support, and processing challenge from other group members.   Therapeutic Goals: 1. Patient will identify why honesty is important to relationships and how honesty overall affects relationships.  2. Patient will identify a situation where they lied or were lied too and the  feelings, thought process, and behaviors surrounding the situation 3. Patient will identify the meaning of being vulnerable, how that feels, and how that correlates to being honest with self and others. 4. Patient will identify situations where they could have told the truth, but instead lied and explain reasons of dishonesty.   Summary of Patient Progress    Therapeutic Modalities:   Cognitive Behavioral Therapy Solution Focused Therapy Motivational Interviewing Brief Therapy  Bill Villanueva  CUEBAS-COLON, LCSW 11/18/2017 12:43 PM  

## 2017-11-18 NOTE — Plan of Care (Signed)
Patient verbalizing understanding of  information received   Some  evidence  of use of coping skills  observed . No safety concerns voiced . Less anxiety    
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
## 2017-11-19 DIAGNOSIS — F1010 Alcohol abuse, uncomplicated: Secondary | ICD-10-CM

## 2017-11-19 DIAGNOSIS — F17210 Nicotine dependence, cigarettes, uncomplicated: Secondary | ICD-10-CM

## 2017-11-19 DIAGNOSIS — Z818 Family history of other mental and behavioral disorders: Secondary | ICD-10-CM

## 2017-11-19 DIAGNOSIS — F4310 Post-traumatic stress disorder, unspecified: Principal | ICD-10-CM

## 2017-11-19 DIAGNOSIS — F1420 Cocaine dependence, uncomplicated: Secondary | ICD-10-CM

## 2017-11-19 DIAGNOSIS — F1220 Cannabis dependence, uncomplicated: Secondary | ICD-10-CM

## 2017-11-19 MED ORDER — DULOXETINE HCL 20 MG PO CPEP
20.0000 mg | ORAL_CAPSULE | Freq: Every day | ORAL | Status: DC
  Administered 2017-11-19: 20 mg via ORAL
  Filled 2017-11-19 (×2): qty 1
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
## 2017-11-19 NOTE — [facility] Group Notes (Signed)
LCSW Group Therapy Note 11/19/2017 1:15pm  Type of Therapy and Topic: Group Therapy: Feelings Around Returning Home & Establishing a Supportive Framework and Supporting Oneself When Supports Not Available  Participation Level: Did Not Attend  Description of Group:  Patients first processed thoughts and feelings about upcoming discharge. These included fears of upcoming changes, lack of change, new living environments, judgements and expectations from others and overall stigma of mental health issues. The group then discussed the definition of a supportive framework, what that looks and feels like, and how do to discern it from an unhealthy non-supportive network. The group identified different types of supports as well as what to do when your family/friends are less than helpful or unavailable  Therapeutic Goals  1. Patient will identify one healthy supportive network that they can use at discharge. 2. Patient will identify one factor of a supportive framework and how to tell it from an unhealthy network. 3. Patient able to identify one coping skill to use when they do not have positive supports from others. 4. Patient will demonstrate ability to communicate their needs through discussion and/or role plays.  Summary of Patient Progress:  Pt was invited to attend group but did not attend.    Therapeutic Modalities Cognitive Behavioral Therapy Motivational Interviewing   Candi Profit  CUEBAS-COLON, LCSW 11/19/2017 10:11 AM
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
## 2017-11-19 NOTE — Progress Notes (Signed)
Patient cooperative and pleasant on approach. He was visible in the milieu interacting well with peers and staff.  He denies SI/HI/AVH. No behavioral issues to report on shift at this time. He appears to be in bed resting quietly.

## 2017-11-19 NOTE — Plan of Care (Signed)
  Progressing Education: Knowledge of General Education information will improve 11/19/2017 2015 - Progressing by Lelan PonsAriwodo, Vernice Bowker, RN Health Behavior/Discharge Planning: Ability to manage health-related needs will improve 11/19/2017 2015 - Progressing by Lelan PonsAriwodo, Berdia Lachman, RN Activity: Risk for activity intolerance will decrease 11/19/2017 2015 - Progressing by Lelan PonsAriwodo, Basir Niven, RN Nutrition: Adequate nutrition will be maintained 11/19/2017 2015 - Progressing by Lelan PonsAriwodo, Joleene Burnham, RN Pain Managment: General experience of comfort will improve 11/19/2017 2015 - Progressing by Lelan PonsAriwodo, Tatem Holsonback, RN Activity: Interest or engagement in leisure activities will improve 11/19/2017 2015 - Progressing by Lelan PonsAriwodo, Francine Hannan, RN Coping: Ability to cope will improve 11/19/2017 2015 - Progressing by Lelan PonsAriwodo, Saina Waage, RN
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
## 2017-11-20 MED ORDER — DULOXETINE HCL 30 MG PO CPEP
60.0000 mg | ORAL_CAPSULE | Freq: Every day | ORAL | Status: DC
  Administered 2017-11-20: 60 mg via ORAL
  Filled 2017-11-20: qty 2

## 2017-11-20 MED ORDER — QUETIAPINE FUMARATE 300 MG PO TABS: 300.0000 mg | ORAL_TABLET | Freq: Every day | ORAL | 1 refills | Status: DC

## 2017-11-20 MED ORDER — DULOXETINE HCL 60 MG PO CPEP: 60.0000 mg | ORAL_CAPSULE | Freq: Every day | ORAL | 3 refills | Status: DC

## 2017-11-20 MED ORDER — PRAZOSIN HCL 2 MG PO CAPS: 2.0000 mg | ORAL_CAPSULE | Freq: Two times a day (BID) | ORAL | 1 refills | Status: DC

## 2017-11-20 MED ORDER — HYDROXYZINE HCL 50 MG PO TABS: 50.0000 mg | ORAL_TABLET | Freq: Three times a day (TID) | ORAL | 1 refills | Status: DC | PRN

## 2017-11-20 MED ORDER — MEGESTROL ACETATE 400 MG/10ML PO SUSP: 400.0000 mg | Freq: Every day | ORAL | 0 refills | Status: DC
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
## 2017-11-20 NOTE — Discharge Summary (Signed)
Patient is alert and oriented X 4. Patient denies SI, HI, and AVH. Patient given discharge instructions and verbalized understanding. Patient received paper prescriptions; verbalized understanding for medication instructions. Patient received belongings from locker and verified all belongings were present. Patient escorted by courtesy vehicle to bus stop to be discharged home.  

## 2017-11-20 NOTE — Progress Notes (Signed)
Patient is calm and cooperating with his medicines and ADLs , contract for safety of self and others and denies any SI/HI and no signs of AVH, 15 minute safety rounds is in progress. No respiratory distress

## 2017-11-20 NOTE — [facility] Suicide Risk Assessment (Signed)
[facility]   Principal Problem: PTSD (post-traumatic stress disorder) Discharge Diagnoses:  Patient Active Problem List   Diagnosis Date Noted  . PTSD (post-traumatic stress disorder) [F43.10] 11/15/2017    Priority: High  . Tobacco use disorder [F17.200] 11/16/2017  . Cocaine use disorder, moderate, dependence (HCC) [F14.20] 11/15/2017  . Alcohol abuse [F10.10] 11/15/2017  . Cannabis use disorder, moderate, dependence (HCC) [F12.20] 11/15/2017    Total Time spent with patient: 35  Musculoskeletal: Strength & Muscle Tone: within normal limits Gait & Station: normal Patient leans: N/A  Psychiatric Specialty Exam: Review of Systems  Neurological: Negative.   Psychiatric/Behavioral: Positive for substance abuse. The patient is nervous/anxious.   All other systems reviewed and are negative.   Blood pressure 119/87, pulse 71, temperature 97.9 F (36.6 C), temperature source Oral, resp. rate 18, height 5\' 5"  (1.651 m), weight 56.9 kg (125 lb 8 oz), SpO2 100 %.Body mass index is 20.88 kg/m.  General Appearance: Casual  Eye Contact::  Good  Speech:  Clear and Coherent409  Volume:  Normal  Mood:  Anxious  Affect:  Appropriate  Thought Process:  Goal Directed and Descriptions of Associations: Intact  Orientation:  Full (Time, Place, and Person)  Thought Content:  WDL  Suicidal Thoughts:  No  Homicidal Thoughts:  No  Memory:  Immediate;   Fair Recent;   Fair Remote;   Fair  Judgement:  Impaired  Insight:  Shallow  Psychomotor Activity:  Normal  Concentration:  Fair  Recall:  [employer]  Fund of Knowledge:Fair  Language: Fair  Akathisia:  No  Handed:  Right  AIMS (if indicated):     Assets:  Communication Skills Desire for Improvement Housing Resilience Social Support  Sleep:  Number of Hours: 6.15  Cognition: WNL  ADL's:  Intact   Mental Status Per Nursing Assessment::   On Admission:     Demographic Factors:  Male, Divorced or widowed,  Caucasian, Low socioeconomic status and Unemployed  Loss Factors: Decline in physical health and Financial problems/change in socioeconomic status  Historical Factors: Impulsivity  Risk Reduction Factors:   Sense of responsibility to family, Living with another person, especially a relative and Positive social support  Continued Clinical Symptoms:  Depression:   Comorbid alcohol abuse/dependence Impulsivity Alcohol/Substance Abuse/Dependencies  Cognitive Features That Contribute To Risk:  None    Suicide Risk:  Minimal: No identifiable suicidal ideation.  Patients presenting with no risk factors but with morbid ruminations; may be classified as minimal risk based on the severity of the depressive symptoms  Follow-up Information    [facility] Go on 12/07/2017.   Why:  Please go to your therapy appointment with [facility] on 12/07/17 at 9AM with Minerva AreolaEric.  Contact information: [address] [place] [address] [phone]           Plan Of Care/Follow-up recommendations:  Activity:  as tolerated Diet:  low sodium heart healthy Other:  keep follow up appointments  Kristine LineaJolanta Ruffus Kamaka, MD 11/20/2017, 8:38 AM

## 2017-12-12 ENCOUNTER — Ambulatory Visit: Payer: No Typology Code available for payment source | Admitting: Pharmacy Technician

## 2017-12-12 DIAGNOSIS — Z79899 Other long term (current) drug therapy: Secondary | ICD-10-CM

## 2017-12-12 NOTE — Progress Notes (Signed)
Met with patient completed financial assistance application for [facility] due to recent visit.  Patient agreed to be responsible for gathering financial information and forwarding to appropriate department within [facility].    Completed [facility] application and contract.  Patient agreed to all terms of the [facility] contract.    Patient approved to receive medication assistance at [facility] through 2019, as long as eligibility continues to be met.    Provided patient with Civil engineer, contracting based on his particular needs.    Referred patient for MTM.  [facility]

## 2017-12-15 ENCOUNTER — Encounter: Payer: Self-pay | Admitting: Pharmacy Technician

## 2018-02-20 ENCOUNTER — Ambulatory Visit: Payer: Medicaid Other | Admitting: Unknown Physician Specialty

## 2018-03-12 ENCOUNTER — Emergency Department: Payer: Medicaid Other | Admitting: Anesthesiology

## 2018-03-12 ENCOUNTER — Encounter: Payer: Self-pay | Admitting: Intensive Care

## 2018-03-12 ENCOUNTER — Other Ambulatory Visit: Payer: Self-pay

## 2018-03-12 ENCOUNTER — Emergency Department
Admission: EM | Admit: 2018-03-12 | Discharge: 2018-03-12 | Disposition: A | Discharge status: Home / Self Care | Payer: Medicaid Other | Attending: Emergency Medicine | Admitting: Emergency Medicine

## 2018-03-12 ENCOUNTER — Encounter
Admission: EM | Disposition: A | Discharge status: Home / Self Care | Payer: Self-pay | Source: Home / Self Care | Attending: Emergency Medicine

## 2018-03-12 DIAGNOSIS — F319 Bipolar disorder, unspecified: Secondary | ICD-10-CM | POA: Insufficient documentation

## 2018-03-12 DIAGNOSIS — Z9114 Patient's other noncompliance with medication regimen: Secondary | ICD-10-CM | POA: Diagnosis not present

## 2018-03-12 DIAGNOSIS — I10 Essential (primary) hypertension: Secondary | ICD-10-CM | POA: Diagnosis not present

## 2018-03-12 DIAGNOSIS — K222 Esophageal obstruction: Secondary | ICD-10-CM | POA: Diagnosis not present

## 2018-03-12 DIAGNOSIS — F200 Paranoid schizophrenia: Secondary | ICD-10-CM | POA: Insufficient documentation

## 2018-03-12 DIAGNOSIS — T18198A Other foreign object in esophagus causing other injury, initial encounter: Secondary | ICD-10-CM | POA: Insufficient documentation

## 2018-03-12 DIAGNOSIS — F17210 Nicotine dependence, cigarettes, uncomplicated: Secondary | ICD-10-CM | POA: Diagnosis not present

## 2018-03-12 DIAGNOSIS — X58XXXA Exposure to other specified factors, initial encounter: Secondary | ICD-10-CM | POA: Insufficient documentation

## 2018-03-12 DIAGNOSIS — Z79899 Other long term (current) drug therapy: Secondary | ICD-10-CM | POA: Diagnosis not present

## 2018-03-12 DIAGNOSIS — K2210 Ulcer of esophagus without bleeding: Secondary | ICD-10-CM | POA: Insufficient documentation

## 2018-03-12 DIAGNOSIS — T18128A Food in esophagus causing other injury, initial encounter: Secondary | ICD-10-CM

## 2018-03-12 DIAGNOSIS — F419 Anxiety disorder, unspecified: Secondary | ICD-10-CM | POA: Diagnosis not present

## 2018-03-12 DIAGNOSIS — R112 Nausea with vomiting, unspecified: Secondary | ICD-10-CM | POA: Diagnosis present

## 2018-03-12 DIAGNOSIS — W44F3XA Food entering into or through a natural orifice, initial encounter: Secondary | ICD-10-CM | POA: Insufficient documentation

## 2018-03-12 HISTORY — DX: Essential (primary) hypertension: I10

## 2018-03-12 HISTORY — PX: ESOPHAGOGASTRODUODENOSCOPY: SHX5428

## 2018-03-12 HISTORY — DX: Bipolar disorder, unspecified: F31.9

## 2018-03-12 HISTORY — DX: Paranoid schizophrenia: F20.0

## 2018-03-12 HISTORY — DX: Post-traumatic stress disorder, unspecified: F43.10

## 2018-03-12 LAB — URINE DRUG SCREEN, QUALITATIVE (ARMC ONLY)
Amphetamines, Ur Screen: POSITIVE — AB
BENZODIAZEPINE, UR SCRN: POSITIVE — AB
Cannabinoid 50 Ng, Ur ~~LOC~~: POSITIVE — AB
Cocaine Metabolite,Ur ~~LOC~~: POSITIVE — AB
MDMA (Ecstasy)Ur Screen: NOT DETECTED
METHADONE SCREEN, URINE: NOT DETECTED
OPIATE, UR SCREEN: NOT DETECTED
Phencyclidine (PCP) Ur S: NOT DETECTED
Tricyclic, Ur Screen: NOT DETECTED

## 2018-03-12 LAB — URINALYSIS, COMPLETE (UACMP) WITH MICROSCOPIC
Glucose, UA: 50 mg/dL — AB
Hgb urine dipstick: NEGATIVE
KETONES UR: 5 mg/dL — AB
Leukocytes, UA: NEGATIVE
Nitrite: NEGATIVE
PH: 5 (ref 5.0–8.0)
PROTEIN: 100 mg/dL — AB
Specific Gravity, Urine: 1.03 (ref 1.005–1.030)

## 2018-03-12 LAB — CBC
HEMATOCRIT: 48.3 % (ref 40.0–52.0)
HEMOGLOBIN: 16.4 g/dL (ref 13.0–18.0)
MCH: 32.9 pg (ref 26.0–34.0)
MCHC: 33.8 g/dL (ref 32.0–36.0)
MCV: 97.3 fL (ref 80.0–100.0)
Platelets: 405 10*3/uL (ref 150–440)
RBC: 4.97 MIL/uL (ref 4.40–5.90)
RDW: 13.6 % (ref 11.5–14.5)
WBC: 14.9 10*3/uL — ABNORMAL HIGH (ref 3.8–10.6)

## 2018-03-12 LAB — COMPREHENSIVE METABOLIC PANEL WITH GFR
ALT: 22 U/L (ref 17–63)
AST: 29 U/L (ref 15–41)
Albumin: 5.3 g/dL — ABNORMAL HIGH (ref 3.5–5.0)
Alkaline Phosphatase: 87 U/L (ref 38–126)
Anion gap: 15 (ref 5–15)
BUN: 35 mg/dL — ABNORMAL HIGH (ref 6–20)
CO2: 23 mmol/L (ref 22–32)
Calcium: 10.3 mg/dL (ref 8.9–10.3)
Chloride: 108 mmol/L (ref 101–111)
Creatinine, Ser: 1.39 mg/dL — ABNORMAL HIGH (ref 0.61–1.24)
GFR calc Af Amer: >60 mL/min
GFR calc non Af Amer: 58 mL/min — ABNORMAL LOW
Glucose, Bld: 139 mg/dL — ABNORMAL HIGH (ref 65–99)
Potassium: 4.1 mmol/L (ref 3.5–5.1)
Sodium: 146 mmol/L — ABNORMAL HIGH (ref 135–145)
Total Bilirubin: 1.3 mg/dL — ABNORMAL HIGH (ref 0.3–1.2)
Total Protein: 9.1 g/dL — ABNORMAL HIGH (ref 6.5–8.1)

## 2018-03-12 LAB — LIPASE, BLOOD: LIPASE: 22 U/L (ref 11–51)

## 2018-03-12 SURGERY — EGD (ESOPHAGOGASTRODUODENOSCOPY)
Anesthesia: General

## 2018-03-12 SURGERY — EGD (ESOPHAGOGASTRODUODENOSCOPY)
Anesthesia: General | Wound class: Clean Contaminated

## 2018-03-12 MED ORDER — PROPOFOL 10 MG/ML IV BOLUS
INTRAVENOUS | Status: AC
  Filled 2018-03-12: qty 20

## 2018-03-12 MED ORDER — PROMETHAZINE HCL 25 MG/ML IJ SOLN: 6.2500 mg | INTRAMUSCULAR | Status: DC | PRN

## 2018-03-12 MED ORDER — GLUCAGON HCL RDNA (DIAGNOSTIC) 1 MG IJ SOLR
1.0000 mg | Freq: Once | INTRAMUSCULAR | Status: AC
  Administered 2018-03-12: 1 mg via INTRAVENOUS
  Filled 2018-03-12: qty 1

## 2018-03-12 MED ORDER — OMEPRAZOLE 40 MG PO CPDR: 40.0000 mg | DELAYED_RELEASE_CAPSULE | Freq: Two times a day (BID) | ORAL | 0 refills | Status: DC

## 2018-03-12 MED ORDER — ONDANSETRON HCL 4 MG/2ML IJ SOLN
4.0000 mg | Freq: Once | INTRAMUSCULAR | Status: AC | PRN
  Administered 2018-03-12: 4 mg via INTRAVENOUS
  Filled 2018-03-12: qty 2

## 2018-03-12 MED ORDER — SODIUM CHLORIDE 0.9 % IV BOLUS
1000.0000 mL | Freq: Once | INTRAVENOUS | Status: AC
  Administered 2018-03-12: 1000 mL via INTRAVENOUS

## 2018-03-12 MED ORDER — ONDANSETRON HCL 4 MG/2ML IJ SOLN
INTRAMUSCULAR | Status: AC
  Filled 2018-03-12: qty 2

## 2018-03-12 MED ORDER — ONDANSETRON HCL 4 MG/2ML IJ SOLN
INTRAMUSCULAR | Status: DC | PRN
  Administered 2018-03-12: 4 mg via INTRAVENOUS

## 2018-03-12 MED ORDER — FENTANYL CITRATE (PF) 100 MCG/2ML IJ SOLN: 25.0000 ug | INTRAMUSCULAR | Status: DC | PRN

## 2018-03-12 MED ORDER — LACTATED RINGERS IV SOLN
INTRAVENOUS | Status: DC | PRN
  Administered 2018-03-12: 22:00:00 via INTRAVENOUS

## 2018-03-12 MED ORDER — SUCCINYLCHOLINE CHLORIDE 20 MG/ML IJ SOLN
INTRAMUSCULAR | Status: DC | PRN
  Administered 2018-03-12: 100 mg via INTRAVENOUS

## 2018-03-12 MED ORDER — OMEPRAZOLE 40 MG PO CPDR: 40.0000 mg | DELAYED_RELEASE_CAPSULE | Freq: Two times a day (BID) | ORAL | 1 refills | Status: DC

## 2018-03-12 MED ORDER — PROPOFOL 10 MG/ML IV BOLUS
INTRAVENOUS | Status: DC | PRN
  Administered 2018-03-12: 200 mg via INTRAVENOUS

## 2018-03-12 NOTE — Anesthesia Postprocedure Evaluation (Signed)
Anesthesia Post Note  Patient: Bill Villanueva  Procedure(s) Performed: ESOPHAGOGASTRODUODENOSCOPY (EGD) with removal of food impaction (N/A )  Patient location during evaluation: PACU Anesthesia Type: General Level of consciousness: awake and alert Pain management: pain level controlled Vital Signs Assessment: post-procedure vital signs reviewed and stable Respiratory status: spontaneous breathing, nonlabored ventilation, respiratory function stable and patient connected to nasal cannula oxygen Cardiovascular status: blood pressure returned to baseline and stable Postop Assessment: no apparent nausea or vomiting Anesthetic complications: no     Last Vitals:  Vitals:   03/12/18 2258 03/12/18 2308  BP: (!) 160/91 (!) 165/70  Pulse: 60 89  Resp: 16 16  Temp: 36.7 C 36.7 C  SpO2: 100% 100%    Last Pain:  Vitals:   03/12/18 2308  TempSrc:   PainSc: 0-No pain                 Lenard SimmerAndrew Collyn Selk

## 2018-03-12 NOTE — Anesthesia Procedure Notes (Signed)
Procedure Name: Intubation Date/Time: 03/12/2018 10:04 PM Performed by: Waldo LaineJustis, Rosalinda Seaman, CRNA Pre-anesthesia Checklist: Patient identified, Patient being monitored, Timeout performed, Emergency Drugs available and Suction available Patient Re-evaluated:Patient Re-evaluated prior to induction Oxygen Delivery Method: Circle system utilized Preoxygenation: Pre-oxygenation with 100% oxygen Induction Type: IV induction, Rapid sequence and Cricoid Pressure applied Ventilation: Mask ventilation without difficulty Laryngoscope Size: Miller and 2 Grade View: Grade I Tube type: Oral Tube size: 7.5 mm Number of attempts: 1 Airway Equipment and Method: Stylet Placement Confirmation: ETT inserted through vocal cords under direct vision,  positive ETCO2 and breath sounds checked- equal and bilateral Secured at: 21 cm Tube secured with: Tape Dental Injury: Teeth and Oropharynx as per pre-operative assessment

## 2018-03-12 NOTE — H&P (Signed)
Arlyss Repress, MD [address]  [address]  [place], [place] [address]  Main: [phone]  Fax: [phone] Pager: [phone]  Primary Care Physician:  [facility] Primary Gastroenterologist:  Dr. Arlyss Repress  Pre-Procedure History & Physical: HPI:  Bill Villanueva is a 52 y.o. male is here for an endoscopy.   Past Medical History:  Diagnosis Date  . Anxiety   . Bipolar 1 disorder (HCC)   . Depression   . Hypertension   . Paranoid schizophrenia (HCC)   . PTSD (post-traumatic stress disorder)     History reviewed. No pertinent surgical history.  Prior to Admission medications   Medication Sig Start Date End Date Taking? Authorizing Provider  DULoxetine (CYMBALTA) 60 MG capsule Take 1 capsule (60 mg total) by mouth daily with breakfast. 11/20/17  Yes Pucilowska, [name], MD  FLUoxetine (PROZAC) 20 MG capsule Take 1 capsule by mouth daily. 08/24/15  Yes [provider]  hydrOXYzine (ATARAX/VISTARIL) 50 MG tablet Take 1 tablet (50 mg total) by mouth 3 (three) times daily as needed for anxiety. 11/20/17  Yes Pucilowska, [name], MD  lisinopril (PRINIVIL,ZESTRIL) 10 MG tablet Take 1 tablet by mouth daily. 08/24/15  Yes [provider]  prazosin (MINIPRESS) 2 MG capsule Take 1 capsule (2 mg total) by mouth 2 (two) times daily. 11/20/17  Yes Pucilowska, [name], MD  QUEtiapine (SEROQUEL) 300 MG tablet Take 1 tablet (300 mg total) by mouth at bedtime. 11/20/17  Yes Pucilowska, [name], MD  megestrol (MEGACE) 400 MG/10ML suspension Take 10 mLs (400 mg total) by mouth daily. Patient not taking: Reported on 03/12/2018 11/20/17   Shari Prows, MD    Allergies as of 03/12/2018  . (No Known Allergies)    History reviewed. No pertinent family history.  Social History   Socioeconomic History  . Marital status: Single    Spouse name: Not on file  . Number of children: Not on file  . Years of education: Not on file    . Highest education level: Not on file  Occupational History  . Not on file  Social Needs  . Financial resource strain: Not on file  . Food insecurity:    Worry: Not on file    Inability: Not on file  . Transportation needs:    Medical: Not on file    Non-medical: Not on file  Tobacco Use  . Smoking status: Heavy Tobacco Smoker    Packs/day: 0.50    Types: Cigarettes  . Smokeless tobacco: Never Used  Substance and Sexual Activity  . Alcohol use: Yes    Comment: occ  . Drug use: Yes    Types: Cocaine, Marijuana  . Sexual activity: Never  Lifestyle  . Physical activity:    Days per week: Not on file    Minutes per session: Not on file  . Stress: Not on file  Relationships  . Social connections:    Talks on phone: Not on file    Gets together: Not on file    Attends religious service: Not on file    Active member of club or organization: Not on file    Attends meetings of clubs or organizations: Not on file    Relationship status: Not on file  . Intimate partner violence:    Fear of current or ex partner: Not on file    Emotionally abused: Not on file    Physically abused: Not on file    Forced  sexual activity: Not on file  Other Topics Concern  . Not on file  Social History Narrative  . Not on file    Review of Systems: See HPI, otherwise negative ROS  Physical Exam: BP (!) 145/89 (BP Location: Left Arm)   Pulse 87   Temp 98.2 F (36.8 C) (Oral)   Ht 5\' 5"  (1.651 m)   Wt 140 lb (63.5 kg)   SpO2 100%   BMI 23.30 kg/m  General:   Alert,  pleasant and cooperative in NAD Head:  Normocephalic and atraumatic. Neck:  Supple; no masses or thyromegaly. Lungs:  Clear throughout to auscultation.    Heart:  Regular rate and rhythm. Abdomen:  Soft, nontender and nondistended. Normal bowel sounds, without guarding, and without rebound.   Neurologic:  Alert and  oriented x4;  grossly normal neurologically.  Impression/Plan: Bill Villanueva is here for an  endoscopy to be performed for food impaction  Risks, benefits, limitations, and alternatives regarding  endoscopy have been reviewed with the patient.  Questions have been answered.  All parties agreeable.   Lannette Donathohini Akhilesh Sassone, MD  03/12/2018, 9:46 PM

## 2018-03-12 NOTE — Op Note (Signed)
[facility] Patient Name: Bill Villanueva Procedure Date: 03/12/2018 9:55 PM MRN: [id number] Account #: [id number] Date of Birth: 08/25/1966 Admit Type: Outpatient Age: 52 Room: [facility] ENDO ROOM 4 Gender: Male Note Status: Finalized Procedure:            Upper GI endoscopy Indications:          Esophageal dysphagia, Foreign body in the esophagus Providers:            Toney Reil MD, MD Referring MD:         No Local Md, MD (Referring MD) Medicines:            Monitored Anesthesia Care Complications:        No immediate complications. Estimated blood loss: None. Procedure:            Pre-Anesthesia Assessment:                       - Prior to the procedure, a History and Physical was                        performed, and patient medications and allergies were                        reviewed. The patient is competent. The risks and                        benefits of the procedure and the sedation options and                        risks were discussed with the patient. All questions                        were answered and informed consent was obtained.                        Patient identification and proposed procedure were                        verified by the physician, the nurse, the                        anesthesiologist, the anesthetist and the technician in                        the pre-procedure area in the procedure room in the                        endoscopy suite. Mental Status Examination: alert and                        oriented. Airway Examination: normal oropharyngeal                        airway and neck mobility. Respiratory Examination:                        clear to auscultation. CV Examination: normal.                        Prophylactic Antibiotics: The patient does not require  prophylactic antibiotics. Prior Anticoagulants: The                        patient has taken no previous anticoagulant or                         antiplatelet agents. ASA Grade Assessment: III - A                        patient with severe systemic disease. After reviewing                        the risks and benefits, the patient was deemed in                        satisfactory condition to undergo the procedure. The                        anesthesia plan was to use general anesthesia.                        Immediately prior to administration of medications, the                        patient was re-assessed for adequacy to receive                        sedatives. The heart rate, respiratory rate, oxygen                        saturations, blood pressure, adequacy of pulmonary                        ventilation, and response to care were monitored                        throughout the procedure. The physical status of the                        patient was re-assessed after the procedure.                       After obtaining informed consent, the endoscope was                        passed under direct vision. Throughout the procedure,                        the patient's blood pressure, pulse, and oxygen                        saturations were monitored continuously. The Endoscope                        was introduced through the mouth, and advanced to the                        second part of duodenum. The upper GI endoscopy was  accomplished without difficulty. The patient tolerated                        the procedure well. Findings:      Food along with a pill was found in the lower third of the esophagus.       Removal of food was accomplished with tripod.      Few superficial esophageal ulcers with eschar with no bleeding and no       stigmata of recent bleeding were found in the lower third of the       esophagus concern for ischemic ulcers secondary to cocaine use.      The entire examined stomach was normal.      The duodenal bulb and second portion of the duodenum were  normal. Impression:           - Food in the lower third of the esophagus. Removal was                        successful.                       - Non-bleeding esophageal ulcers.                       - Normal stomach.                       - Normal duodenal bulb and second portion of the                        duodenum. Recommendation:       - Discharge patient to home.                       - Chopped diet and mechanical soft diet indefinitely.                       - Use Prilosec (omeprazole) 40 mg PO BID indefinitely.                       - Return to GI clinic in 2 weeks. Procedure Code(s):    --- Professional ---                       [phone], Esophagogastroduodenoscopy, flexible, transoral;                        with removal of foreign body(s) Diagnosis Code(s):    --- Professional ---                       U72.536U, Food in esophagus causing other injury,                        initial encounter                       K22.10, Ulcer of esophagus without bleeding                       R13.14, Dysphagia, pharyngoesophageal phase                       T18.108A, Unspecified foreign  body in esophagus causing                        other injury, initial encounter CPT copyright 2017 American Medical Association. All rights reserved. The codes documented in this report are preliminary and upon coder review may  be revised to meet current compliance requirements. Dr. Libby Maw Toney Reil MD, MD 03/12/2018 10:25:55 PM This report has been signed electronically. Number of Addenda: 0 Note Initiated On: 03/12/2018 9:55 PM      [facility]

## 2018-03-12 NOTE — Anesthesia Post-op Follow-up Note (Signed)
Anesthesia QCDR form completed.        

## 2018-03-12 NOTE — Anesthesia Preprocedure Evaluation (Addendum)
Anesthesia Evaluation  Patient identified by MRN, date of birth, ID band Patient awake    Reviewed: Allergy & Precautions, H&P , NPO status , Patient's Chart, lab work & pertinent test results, reviewed documented beta blocker date and time   History of Anesthesia Complications Negative for: history of anesthetic complications  Airway Mallampati: II  TM Distance: >3 FB Neck ROM: full    Dental  (+) Dental Advidsory Given, Missing, Chipped, Poor Dentition   Pulmonary neg shortness of breath, asthma , COPD,  COPD inhaler, neg recent URI, Current Smoker,           Cardiovascular Exercise Tolerance: Good hypertension, (-) angina(-) CAD, (-) Past MI, (-) Cardiac Stents and (-) CABG (-) dysrhythmias (-) Valvular Problems/Murmurs     Neuro/Psych PSYCHIATRIC DISORDERS Anxiety Depression Bipolar Disorder Schizophrenia negative neurological ROS     GI/Hepatic Neg liver ROS, GERD  ,  Endo/Other  negative endocrine ROS  Renal/GU negative Renal ROS  negative genitourinary   Musculoskeletal   Abdominal   Peds  Hematology negative hematology ROS (+)   Anesthesia Other Findings Past Medical History: No date: Anxiety No date: Bipolar 1 disorder (HCC) No date: Depression No date: Hypertension No date: Paranoid schizophrenia (HCC) No date: PTSD (post-traumatic stress disorder)  Patient admits to using cocaine 1 week ago, but denied use in the last 24-48 hours.  Due to the emergent nature of the procedure we will proceed at this time.  Reproductive/Obstetrics negative OB ROS                           Anesthesia Physical Anesthesia Plan  ASA: III and emergent  Anesthesia Plan: General   Post-op Pain Management:    Induction: Intravenous, Rapid sequence and Cricoid pressure planned  PONV Risk Score and Plan: 1 and Ondansetron and Dexamethasone  Airway Management Planned: Oral ETT  Additional  Equipment:   Intra-op Plan:   Post-operative Plan: Extubation in OR  Informed Consent: I have reviewed the patients History and Physical, chart, labs and discussed the procedure including the risks, benefits and alternatives for the proposed anesthesia with the patient or authorized representative who has indicated his/her understanding and acceptance.   Dental Advisory Given  Plan Discussed with: Anesthesiologist, CRNA and Surgeon  Anesthesia Plan Comments:        Anesthesia Quick Evaluation

## 2018-03-12 NOTE — ED Triage Notes (Signed)
Patient reports eating steak and eggs yesterday morning around 0700 and ever since has been having N/V. Denies diarrhea. Reports he cannot keep liquid or food down since. Reports vomiting up steak yesterday and today some. Patient given zofran by EMS.

## 2018-03-12 NOTE — ED Notes (Signed)
Pt drinking water with n/v.  Pt reports n/v/d since yesterday.  Pt reports abd pain.  Iv in place.

## 2018-03-12 NOTE — Consult Note (Signed)
Bill Darby, MD [address]  [place]  [place], [place] [address]  Main: [phone]  Fax: [phone] Pager: [phone]   Consultation  Referring Provider:     No ref. provider found Primary Care Physician:  [facility] [place] Primary Gastroenterologist: None        Reason for Consultation:     Food impaction  Date of Admission:  03/12/2018 Date of Consultation:  03/12/2018         HPI:   Bill Villanueva is a 52 y.o. male with polysubstance abuse, chronic GERD presented to ER after a piece of meat stuck in his throat and couldn't pass down. This happened yesterday around 7 AM. He swallowed some soda and was able to regurgitate a part of the meat out. He continued to have sensation of the meat stuck and therefore presented to the ER later today in the evening. He says he cannot swallow the secretions. He ran out of omeprazole about 1 year ago, he was taking it for heartburn and difficulty swallowing. He reports snorting cocaine end of May at the party. He smokes marijuana  NSAIDs: none  Antiplts/Anticoagulants/Anti thrombotics: none  GI Procedures: none  Past Medical History:  Diagnosis Date  . Anxiety   . Bipolar 1 disorder ([place])   . Depression   . Hypertension   . Paranoid schizophrenia ([place])   . PTSD (post-traumatic stress disorder)     History reviewed. No pertinent surgical history.  Prior to Admission medications   Medication Sig Start Date End Date Taking? Authorizing Provider  DULoxetine (CYMBALTA) 60 MG capsule Take 1 capsule (60 mg total) by mouth daily with breakfast. 11/20/17  Yes Pucilowska, [name], MD  FLUoxetine (PROZAC) 20 MG capsule Take 1 capsule by mouth daily. 08/24/15  Yes [provider]  hydrOXYzine (ATARAX/VISTARIL) 50 MG tablet Take 1 tablet (50 mg total) by mouth 3 (three) times daily as needed for anxiety. 11/20/17  Yes Pucilowska, [name], MD  lisinopril (PRINIVIL,ZESTRIL) 10 MG tablet Take 1  tablet by mouth daily. 08/24/15  Yes [provider]  prazosin (MINIPRESS) 2 MG capsule Take 1 capsule (2 mg total) by mouth 2 (two) times daily. 11/20/17  Yes Pucilowska, [name], MD  QUEtiapine (SEROQUEL) 300 MG tablet Take 1 tablet (300 mg total) by mouth at bedtime. 11/20/17  Yes Pucilowska, [name], MD  megestrol (MEGACE) 400 MG/10ML suspension Take 10 mLs (400 mg total) by mouth daily. Patient not taking: Reported on 03/12/2018 11/20/17   Clovis Fredrickson, MD    History reviewed. No pertinent family history.   Social History   Tobacco Use  . Smoking status: Heavy Tobacco Smoker    Packs/day: 0.50    Types: Cigarettes  . Smokeless tobacco: Never Used  Substance Use Topics  . Alcohol use: Yes    Comment: occ  . Drug use: Yes    Types: Cocaine, Marijuana    Allergies as of 03/12/2018  . (No Known Allergies)    Review of Systems:    All systems reviewed and negative except where noted in HPI.   Physical Exam:  Vital signs in last 24 hours: Temp:  [98.2 F (36.8 C)] 98.2 F (36.8 C) (06/17 1649) Pulse Rate:  [87] 87 (06/17 1649) BP: (145)/(89) 145/89 (06/17 1649) SpO2:  [100 %] 100 % (06/17 1649) Weight:  [140 lb (63.5 kg)] 140 lb (63.5 kg) (06/17 1652)   General:   Pleasant, cooperative in NAD, thin  built Head:  Normocephalic and atraumatic. Eyes:   No icterus.   Conjunctiva pink. PERRLA. Ears:  Normal auditory acuity. Neck:  Supple; no masses or thyroidomegaly Lungs: Respirations even and unlabored. Lungs clear to auscultation bilaterally.   No wheezes, crackles, or rhonchi.  Heart:  Regular rate and rhythm;  Without murmur, clicks, rubs or gallops Abdomen:  Soft, nondistended, nontender. Normal bowel sounds. No appreciable masses or hepatomegaly.  No rebound or guarding.  Rectal:  Not performed. Msk:  Symmetrical without gross deformities.  Strength normal  Extremities:  Without edema, cyanosis or clubbing. Neurologic:  Alert and oriented x3;   grossly normal neurologically. Skin:  Intact without significant lesions or rashes. Cervical Nodes:  No significant cervical adenopathy. Psych:  Alert and cooperative. Normal affect.  LAB RESULTS: CBC Latest Ref Rng & Units 03/12/2018 11/15/2017 11/13/2017  WBC 3.8 - 10.6 K/uL 14.9(H) 8.2 6.1  Hemoglobin 13.0 - 18.0 g/dL 16.4 14.5 14.1  Hematocrit 40.0 - 52.0 % 48.3 42.7 41.1  Platelets 150 - 440 K/uL 405 437 406    BMET BMP Latest Ref Rng & Units 03/12/2018 11/15/2017 11/13/2017  Glucose 65 - 99 mg/dL 139(H) 96 94  BUN 6 - 20 mg/dL 35(H) 16 13  Creatinine 0.61 - 1.24 mg/dL 1.39(H) 0.94 0.83  Sodium 135 - 145 mmol/L 146(H) 139 141  Potassium 3.5 - 5.1 mmol/L 4.1 4.9 4.3  Chloride 101 - 111 mmol/L 108 103 107  CO2 22 - 32 mmol/L 23 27 23   Calcium 8.9 - 10.3 mg/dL 10.3 9.5 9.2    LFT Hepatic Function Latest Ref Rng & Units 03/12/2018 11/15/2017 11/13/2017  Total Protein 6.5 - 8.1 g/dL 9.1(H) 8.5(H) 8.0  Albumin 3.5 - 5.0 g/dL 5.3(H) 4.9 4.8  AST 15 - 41 U/L 29 41 37  ALT 17 - 63 U/L 22 26 25   Alk Phosphatase 38 - 126 U/L 87 68 61  Total Bilirubin 0.3 - 1.2 mg/dL 1.3(H) 1.1 0.8     STUDIES: No results found.    Impression / Plan:   Bill Villanueva is a 52 y.o. male with polysubstance abuse, anxiety, presents with food impaction after 24 hours of swallowing meat. Discussed with patient about emergent endoscopy and he is agreeable. Consent obtained  We will proceed with EGD  I have discussed alternative options, risks & benefits,  which include, but are not limited to, bleeding, infection, perforation,respiratory complication & drug reaction.  The patient agrees with this plan & written consent will be obtained.    Thank you for involving me in the care of this patient.      LOS: 0 days   Sherri Sear, MD  03/12/2018, 9:50 PM   Note: This dictation was prepared with Dragon dictation along with smaller phrase technology. Any transcriptional errors that result from this  process are unintentional.

## 2018-03-12 NOTE — ED Notes (Signed)
Report given to OR RN Cala BradfordKimberly.

## 2018-03-12 NOTE — Transfer of Care (Signed)
Immediate Anesthesia Transfer of Care Note  Patient: Bill Villanueva  Procedure(s) Performed: ESOPHAGOGASTRODUODENOSCOPY (EGD) with removal of food impaction (N/A )  Patient Location: PACU  Anesthesia Type:General  Level of Consciousness: awake, alert , oriented and patient cooperative  Airway & Oxygen Therapy: Patient Spontanous Breathing  Post-op Assessment: Report given to RN and Post -op Vital signs reviewed and stable  Post vital signs: Reviewed and stable  Last Vitals:  Vitals Value Taken Time  BP 145/82 03/12/2018 10:38 PM  Temp 36.7 C 03/12/2018 10:38 PM  Pulse 81 03/12/2018 10:42 PM  Resp 17 03/12/2018 10:42 PM  SpO2 100 % 03/12/2018 10:42 PM  Vitals shown include unvalidated device data.  Last Pain:  Vitals:   03/12/18 2238  TempSrc:   PainSc: Asleep         Complications: No apparent anesthesia complications

## 2018-03-12 NOTE — ED Provider Notes (Signed)
[facility] Emergency Department Provider Note  ____________________________________________   First MD Initiated Contact with Patient 03/12/18 1808     (approximate)  I have reviewed the triage vital signs and the nursing notes.   HISTORY  Chief Complaint Nausea and Emesis   HPI Bill Villanueva is a 52 y.o. male who comes to the emergency department via EMS with abdominal pain nausea vomiting and inability to swallow for the past 36 hours.  He ate a piece of steak for breakfast yesterday and felt it "gets stuck in my throat".  He then had another piece of steak trying to force the first 1 down.  He was able to finish out the second piece of steak but he said that the first 1 became stuck in his mid chest.  He has been unable to swallow anything including spit or water ever since.  He does have a long-standing history of gastric reflux for which she is supposed to take omeprazole however he is noncompliant.  He has severe aching discomfort in his mid chest worse when trying to swallow improved when not.  He has never had an endoscopy before.  His pain is nonradiating.  Past Medical History:  Diagnosis Date  . Anxiety   . Bipolar 1 disorder (HCC)   . Depression   . Hypertension   . Paranoid schizophrenia (HCC)   . PTSD (post-traumatic stress disorder)     Patient Active Problem List   Diagnosis Date Noted  . Tobacco use disorder 11/16/2017  . Cocaine use disorder, moderate, dependence (HCC) 11/15/2017  . Alcohol abuse 11/15/2017  . Cannabis use disorder, moderate, dependence (HCC) 11/15/2017  . PTSD (post-traumatic stress disorder) 11/15/2017    History reviewed. No pertinent surgical history.  Prior to Admission medications   Medication Sig Start Date End Date Taking? Authorizing Provider  DULoxetine (CYMBALTA) 60 MG capsule Take 1 capsule (60 mg total) by mouth daily with breakfast. 11/20/17   Pucilowska, [name], MD  hydrOXYzine  (ATARAX/VISTARIL) 50 MG tablet Take 1 tablet (50 mg total) by mouth 3 (three) times daily as needed for anxiety. 11/20/17   Pucilowska, Braulio ConteJolanta B, MD  megestrol (MEGACE) 400 MG/10ML suspension Take 10 mLs (400 mg total) by mouth daily. 11/20/17   Pucilowska, Braulio ConteJolanta B, MD  prazosin (MINIPRESS) 2 MG capsule Take 1 capsule (2 mg total) by mouth 2 (two) times daily. 11/20/17   Pucilowska, Braulio ConteJolanta B, MD  QUEtiapine (SEROQUEL) 300 MG tablet Take 1 tablet (300 mg total) by mouth at bedtime. 11/20/17   Pucilowska, Ellin GoodieJolanta B, MD    Allergies Patient has no known allergies.  History reviewed. No pertinent family history.  Social History Social History   Tobacco Use  . Smoking status: Heavy Tobacco Smoker    Packs/day: 0.50    Types: Cigarettes  . Smokeless tobacco: Never Used  Substance Use Topics  . Alcohol use: Yes    Comment: occ  . Drug use: Yes    Types: Cocaine, Marijuana    Review of Systems Constitutional: No fever/chills Eyes: No visual changes. ENT: No sore throat. Cardiovascular: Positive for chest pain. Respiratory: Denies shortness of breath. Gastrointestinal: Positive for abdominal pain.  Positive for nausea, positive for vomiting.  No diarrhea.  No constipation. Genitourinary: Negative for dysuria. Musculoskeletal: Negative for back pain. Skin: Negative for rash. Neurological: Negative for headaches, focal weakness or numbness.   ____________________________________________   PHYSICAL EXAM:  VITAL SIGNS: ED Triage Vitals  Enc Vitals Group  BP 03/12/18 1649 (!) 145/89     Pulse Rate 03/12/18 1649 87     Resp --      Temp 03/12/18 1649 98.2 F (36.8 C)     Temp Source 03/12/18 1649 Oral     SpO2 03/12/18 1649 100 %     Weight 03/12/18 1652 140 lb (63.5 kg)     Height 03/12/18 1652 5\' 5"  (1.651 m)     Head Circumference --      Peak Flow --      Pain Score 03/12/18 1657 7     Pain Loc --      Pain Edu? --      Excl. in GC? --     Constitutional: Alert  and oriented x4 appears very anxious actively vomiting when I walked into the room Eyes: PERRL EOMI. Head: Atraumatic. Nose: No congestion/rhinnorhea. Mouth/Throat: No trismus Neck: No stridor.   Cardiovascular: Normal rate, regular rhythm. Grossly normal heart sounds.  Good peripheral circulation. Respiratory: Normal respiratory effort.  No retractions. Lungs CTAB and moving good air Gastrointestinal: Soft nontender Musculoskeletal: No lower extremity edema   Neurologic:  Normal speech and language. No gross focal neurologic deficits are appreciated. Skin:  Skin is warm, dry and intact. No rash noted. Psychiatric: Anxious appearing    ____________________________________________   DIFFERENTIAL includes but not limited to  Esophageal food impaction, dehydration, esophageal perforation ____________________________________________   LABS (all labs ordered are listed, but only abnormal results are displayed)  Labs Reviewed  COMPREHENSIVE METABOLIC PANEL - Abnormal; Notable for the following components:      Result Value   Sodium 146 (*)    Glucose, Bld 139 (*)    BUN 35 (*)    Creatinine, Ser 1.39 (*)    Total Protein 9.1 (*)    Albumin 5.3 (*)    Total Bilirubin 1.3 (*)    GFR calc non Af Amer 58 (*)    All other components within normal limits  CBC - Abnormal; Notable for the following components:   WBC 14.9 (*)    All other components within normal limits  LIPASE, BLOOD  URINALYSIS, COMPLETE (UACMP) WITH MICROSCOPIC    Lab work reviewed by me with elevated BUN and creatinine consistent with dehydration __________________________________________  EKG   ____________________________________________  RADIOLOGY   ____________________________________________   PROCEDURES  Procedure(s) performed: no  Procedures  Critical Care performed: no  Observation: no ____________________________________________   INITIAL IMPRESSION / ASSESSMENT AND PLAN / ED  COURSE  Pertinent labs & imaging results that were available during my care of the patient were reviewed by me and considered in my medical decision making (see chart for details).  On arrival the patient is uncomfortable appearing actively retching and vomiting.  I attempted to have the patient drink diet ginger ale for 5 times and initially he felt there was some "movement" however subsequently the food impaction stop moving and he continues to persist with nausea and vomiting.  We will give him IV glucagon now but also reach out to gastroenterology.     ----------------------------------------- 6:45 PM on 03/12/2018 ----------------------------------------- I spoke with on-call gastroenterologist Dr. Allegra Lai who will kindly come evaluate the patient and likely take him for endoscopy to remove the esophageal food impaction.  We will keep him n.p.o. for now.  He requires emergent endoscopy as he is unable to keep down even his saliva.  ____________________________________________   FINAL CLINICAL IMPRESSION(S) / ED DIAGNOSES  Final diagnoses:  Esophageal obstruction due to  food impaction      NEW MEDICATIONS STARTED DURING THIS VISIT:  New Prescriptions   No medications on file     Note:  This document was prepared using Dragon voice recognition software and may include unintentional dictation errors.     Merrily Brittle, MD 03/12/18 Windell Moment

## 2018-03-14 ENCOUNTER — Encounter: Payer: Self-pay | Admitting: Gastroenterology

## 2018-04-17 ENCOUNTER — Telehealth: Payer: Self-pay | Admitting: Pharmacy Technician

## 2018-04-17 NOTE — Telephone Encounter (Signed)
Patient has full Medicaid with prescription drug coverage. [facility].  No medication assistance will be provided to patient.  Patient notified.  Bill DacostaBetty J. Sir Mallis Care Manager [facility]

## 2018-04-26 ENCOUNTER — Other Ambulatory Visit: Payer: Self-pay

## 2018-04-27 ENCOUNTER — Encounter: Payer: Self-pay | Admitting: Gastroenterology

## 2018-04-27 ENCOUNTER — Ambulatory Visit: Payer: Medicaid Other | Admitting: Gastroenterology

## 2018-10-04 ENCOUNTER — Emergency Department
Admission: EM | Admit: 2018-10-04 | Discharge: 2018-10-04 | Disposition: A | Discharge status: Home / Self Care | Payer: No Typology Code available for payment source | Attending: Emergency Medicine | Admitting: Emergency Medicine

## 2018-10-04 ENCOUNTER — Encounter: Payer: Self-pay | Admitting: Medical Oncology

## 2018-10-04 DIAGNOSIS — S299XXA Unspecified injury of thorax, initial encounter: Secondary | ICD-10-CM | POA: Insufficient documentation

## 2018-10-04 DIAGNOSIS — Y93I9 Activity, other involving external motion: Secondary | ICD-10-CM | POA: Diagnosis not present

## 2018-10-04 DIAGNOSIS — Y998 Other external cause status: Secondary | ICD-10-CM | POA: Diagnosis not present

## 2018-10-04 DIAGNOSIS — Y92410 Unspecified street and highway as the place of occurrence of the external cause: Secondary | ICD-10-CM | POA: Diagnosis not present

## 2018-10-04 DIAGNOSIS — M7918 Myalgia, other site: Secondary | ICD-10-CM

## 2018-10-04 MED ORDER — IBUPROFEN 600 MG PO TABS
600.0000 mg | ORAL_TABLET | Freq: Once | ORAL | Status: AC
  Administered 2018-10-04: 600 mg via ORAL
  Filled 2018-10-04: qty 1

## 2018-10-04 MED ORDER — IBUPROFEN 600 MG PO TABS: 600.0000 mg | ORAL_TABLET | Freq: Three times a day (TID) | ORAL | 0 refills | Status: DC | PRN

## 2018-10-04 MED ORDER — OXYCODONE-ACETAMINOPHEN 7.5-325 MG PO TABS: 1.0000 | ORAL_TABLET | Freq: Four times a day (QID) | ORAL | 0 refills | Status: DC | PRN

## 2018-10-04 MED ORDER — CYCLOBENZAPRINE HCL 10 MG PO TABS
10.0000 mg | ORAL_TABLET | Freq: Once | ORAL | Status: AC
  Administered 2018-10-04: 10 mg via ORAL
  Filled 2018-10-04: qty 1

## 2018-10-04 MED ORDER — CYCLOBENZAPRINE HCL 10 MG PO TABS: 10.0000 mg | ORAL_TABLET | Freq: Three times a day (TID) | ORAL | 0 refills | Status: DC | PRN

## 2018-10-04 MED ORDER — OXYCODONE-ACETAMINOPHEN 5-325 MG PO TABS
1.0000 | ORAL_TABLET | Freq: Once | ORAL | Status: AC
  Administered 2018-10-04: 1 via ORAL
  Filled 2018-10-04: qty 1
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
## 2018-10-04 NOTE — Discharge Instructions (Signed)
Follow discharge care instruction take medication as directed. °
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
## 2018-10-29 ENCOUNTER — Emergency Department: Payer: No Typology Code available for payment source

## 2018-10-29 ENCOUNTER — Other Ambulatory Visit: Payer: Self-pay

## 2018-10-29 ENCOUNTER — Emergency Department
Admission: EM | Admit: 2018-10-29 | Discharge: 2018-10-29 | Disposition: A | Discharge status: Home / Self Care | Payer: No Typology Code available for payment source | Attending: Emergency Medicine | Admitting: Emergency Medicine

## 2018-10-29 DIAGNOSIS — F1410 Cocaine abuse, uncomplicated: Secondary | ICD-10-CM | POA: Insufficient documentation

## 2018-10-29 DIAGNOSIS — Y998 Other external cause status: Secondary | ICD-10-CM | POA: Diagnosis not present

## 2018-10-29 DIAGNOSIS — F17210 Nicotine dependence, cigarettes, uncomplicated: Secondary | ICD-10-CM | POA: Insufficient documentation

## 2018-10-29 DIAGNOSIS — M5412 Radiculopathy, cervical region: Secondary | ICD-10-CM | POA: Diagnosis not present

## 2018-10-29 DIAGNOSIS — F1210 Cannabis abuse, uncomplicated: Secondary | ICD-10-CM | POA: Insufficient documentation

## 2018-10-29 DIAGNOSIS — R200 Anesthesia of skin: Secondary | ICD-10-CM | POA: Diagnosis not present

## 2018-10-29 DIAGNOSIS — R202 Paresthesia of skin: Secondary | ICD-10-CM | POA: Diagnosis not present

## 2018-10-29 DIAGNOSIS — I10 Essential (primary) hypertension: Secondary | ICD-10-CM | POA: Diagnosis not present

## 2018-10-29 DIAGNOSIS — Y9389 Activity, other specified: Secondary | ICD-10-CM | POA: Diagnosis not present

## 2018-10-29 DIAGNOSIS — Y92410 Unspecified street and highway as the place of occurrence of the external cause: Secondary | ICD-10-CM | POA: Diagnosis not present

## 2018-10-29 DIAGNOSIS — M25512 Pain in left shoulder: Secondary | ICD-10-CM | POA: Diagnosis not present

## 2018-10-29 DIAGNOSIS — Z79899 Other long term (current) drug therapy: Secondary | ICD-10-CM | POA: Diagnosis not present

## 2018-10-29 DIAGNOSIS — S161XXA Strain of muscle, fascia and tendon at neck level, initial encounter: Secondary | ICD-10-CM | POA: Diagnosis not present

## 2018-10-29 DIAGNOSIS — S199XXA Unspecified injury of neck, initial encounter: Secondary | ICD-10-CM | POA: Diagnosis present

## 2018-10-29 MED ORDER — BACLOFEN 10 MG PO TABS: 10.0000 mg | ORAL_TABLET | Freq: Three times a day (TID) | ORAL | 1 refills | Status: AC

## 2018-10-29 MED ORDER — PREDNISONE 10 MG (21) PO TBPK: ORAL_TABLET | ORAL | 0 refills | Status: DC
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
## 2018-10-29 NOTE — Discharge Instructions (Addendum)
Follow-up with your regular doctor if not better in 3 days.  Return emergency department worsening.  Take medications as prescribed.  Call Dr. Neomia Glass office for an appointment.

## 2018-10-30 ENCOUNTER — Ambulatory Visit: Payer: Self-pay | Admitting: Pharmacy Technician

## 2018-11-01 ENCOUNTER — Ambulatory Visit: Payer: No Typology Code available for payment source | Admitting: Pharmacy Technician

## 2018-11-01 DIAGNOSIS — Z79899 Other long term (current) drug therapy: Secondary | ICD-10-CM

## 2018-11-01 NOTE — Progress Notes (Signed)
Patient scheduled for eligibility appointment at [facility].  Patient did not show for the appointment on 10/30/2018 at 2:00p.m.  Patient did not reschedule eligibility appointment.  [facility] unable to provide additional medication assistance until eligibility is determined.  Will contact patient to try to do phone consult.  Sherilyn Dacosta Care Manager [facility]

## 2018-11-01 NOTE — Progress Notes (Signed)
Completed patient eligibility over the phone.  Read [facility]'s contract to patient.  Patient verbally acknowledged that he understood contract and stated that he had no questions.  Mailing contract to patient to sign.  Patient approved to receive medication assistance at [facility] as long as eligibility criteria continues to be met.  [facility]

## 2019-03-28 ENCOUNTER — Ambulatory Visit: Payer: Self-pay | Admitting: General Surgery

## 2019-04-09 ENCOUNTER — Encounter: Payer: Self-pay | Admitting: General Surgery

## 2019-04-09 ENCOUNTER — Ambulatory Visit: Payer: Self-pay | Admitting: General Surgery

## 2019-04-30 ENCOUNTER — Ambulatory Visit: Payer: Self-pay | Admitting: General Surgery

## 2019-05-14 ENCOUNTER — Ambulatory Visit: Payer: Self-pay | Admitting: General Surgery

## 2019-05-21 ENCOUNTER — Ambulatory Visit: Payer: Self-pay | Admitting: General Surgery

## 2019-05-21 ENCOUNTER — Encounter: Payer: Self-pay | Admitting: General Surgery

## 2019-07-18 ENCOUNTER — Emergency Department: Payer: Medicaid Other

## 2019-07-18 ENCOUNTER — Other Ambulatory Visit: Payer: Self-pay

## 2019-07-18 ENCOUNTER — Emergency Department
Admission: EM | Admit: 2019-07-18 | Discharge: 2019-07-18 | Disposition: A | Discharge status: Home / Self Care | Payer: Medicaid Other | Attending: Emergency Medicine | Admitting: Emergency Medicine

## 2019-07-18 DIAGNOSIS — W109XXA Fall (on) (from) unspecified stairs and steps, initial encounter: Secondary | ICD-10-CM | POA: Insufficient documentation

## 2019-07-18 DIAGNOSIS — Y999 Unspecified external cause status: Secondary | ICD-10-CM | POA: Insufficient documentation

## 2019-07-18 DIAGNOSIS — F10929 Alcohol use, unspecified with intoxication, unspecified: Secondary | ICD-10-CM | POA: Diagnosis not present

## 2019-07-18 DIAGNOSIS — Z79899 Other long term (current) drug therapy: Secondary | ICD-10-CM | POA: Insufficient documentation

## 2019-07-18 DIAGNOSIS — F17210 Nicotine dependence, cigarettes, uncomplicated: Secondary | ICD-10-CM | POA: Insufficient documentation

## 2019-07-18 DIAGNOSIS — Y908 Blood alcohol level of 240 mg/100 ml or more: Secondary | ICD-10-CM | POA: Diagnosis not present

## 2019-07-18 DIAGNOSIS — S022XXA Fracture of nasal bones, initial encounter for closed fracture: Secondary | ICD-10-CM | POA: Diagnosis not present

## 2019-07-18 DIAGNOSIS — Y92009 Unspecified place in unspecified non-institutional (private) residence as the place of occurrence of the external cause: Secondary | ICD-10-CM | POA: Diagnosis not present

## 2019-07-18 DIAGNOSIS — S0990XA Unspecified injury of head, initial encounter: Secondary | ICD-10-CM | POA: Diagnosis present

## 2019-07-18 DIAGNOSIS — S01112A Laceration without foreign body of left eyelid and periocular area, initial encounter: Secondary | ICD-10-CM | POA: Diagnosis not present

## 2019-07-18 DIAGNOSIS — S0181XA Laceration without foreign body of other part of head, initial encounter: Secondary | ICD-10-CM

## 2019-07-18 DIAGNOSIS — Y939 Activity, unspecified: Secondary | ICD-10-CM | POA: Insufficient documentation

## 2019-07-18 HISTORY — DX: Unspecified intracranial injury with loss of consciousness of unspecified duration, initial encounter: S06.9X9A

## 2019-07-18 HISTORY — DX: Unspecified intracranial injury with loss of consciousness status unknown, initial encounter: S06.9XAA

## 2019-07-18 LAB — COMPREHENSIVE METABOLIC PANEL
ALT: 16 U/L (ref 0–44)
AST: 22 U/L (ref 15–41)
Albumin: 4.4 g/dL (ref 3.5–5.0)
Alkaline Phosphatase: 60 U/L (ref 38–126)
Anion gap: 9 (ref 5–15)
BUN: 14 mg/dL (ref 6–20)
CO2: 23 mmol/L (ref 22–32)
Calcium: 8.8 mg/dL — ABNORMAL LOW (ref 8.9–10.3)
Chloride: 104 mmol/L (ref 98–111)
Creatinine, Ser: 0.7 mg/dL (ref 0.61–1.24)
GFR calc Af Amer: >60 mL/min (ref >60)
GFR calc non Af Amer: >60 mL/min (ref >60)
Glucose, Bld: 105 mg/dL — ABNORMAL HIGH (ref 70–99)
Potassium: 3.4 mmol/L — ABNORMAL LOW (ref 3.5–5.1)
Sodium: 136 mmol/L (ref 135–145)
Total Bilirubin: 0.5 mg/dL (ref 0.3–1.2)
Total Protein: 7.3 g/dL (ref 6.5–8.1)

## 2019-07-18 LAB — CBC
HCT: 41.6 % (ref 39.0–52.0)
Hemoglobin: 13.9 g/dL (ref 13.0–17.0)
MCH: 32.4 pg (ref 26.0–34.0)
MCHC: 33.4 g/dL (ref 30.0–36.0)
MCV: 97 fL (ref 80.0–100.0)
Platelets: 403 10*3/uL — ABNORMAL HIGH (ref 150–400)
RBC: 4.29 MIL/uL (ref 4.22–5.81)
RDW: 13.2 % (ref 11.5–15.5)
WBC: 11.6 10*3/uL — ABNORMAL HIGH (ref 4.0–10.5)
nRBC: 0 % (ref 0.0–0.2)

## 2019-07-18 LAB — ETHANOL: Alcohol, Ethyl (B): 372 mg/dL (ref <10)

## 2019-07-18 MED ORDER — DIPHENHYDRAMINE HCL 50 MG/ML IJ SOLN
INTRAMUSCULAR | Status: AC
  Administered 2019-07-18: 50 mg via INTRAVENOUS
  Filled 2019-07-18: qty 1

## 2019-07-18 MED ORDER — LORAZEPAM 2 MG/ML IJ SOLN
1.0000 mg | Freq: Once | INTRAMUSCULAR | Status: AC
  Administered 2019-07-18: 01:00:00 1 mg via INTRAVENOUS
  Filled 2019-07-18: qty 1

## 2019-07-18 MED ORDER — DIPHENHYDRAMINE HCL 50 MG/ML IJ SOLN
50.0000 mg | Freq: Once | INTRAMUSCULAR | Status: AC
  Administered 2019-07-18: 01:00:00 50 mg via INTRAVENOUS

## 2019-07-18 NOTE — ED Notes (Signed)
Patient currently laying in bed resting with eyes closed.  

## 2019-07-18 NOTE — ED Notes (Signed)
Pt resting in bed with eyes closed.

## 2019-07-18 NOTE — ED Notes (Signed)
Went to wake up pt- pt asked "what happened?" while holding his nose- explained to pt that he fell and broke his nose- pt given cup of water

## 2019-07-18 NOTE — ED Notes (Signed)
Pt's ride called and said on her way.  States approx 30 min away.

## 2019-07-18 NOTE — ED Notes (Addendum)
Pt given wipes to clean face and ambulated around room with no difficulty- Dr Quentin Cornwall notified Jeannie Done a call [phone]) to get pt a ride home- stated she was working on it and would call back when she found one

## 2019-07-18 NOTE — ED Notes (Signed)
Pt resting with eyes closed.

## 2019-07-18 NOTE — ED Notes (Signed)
Pt continues to rest in bed with lights off 

## 2019-07-18 NOTE — ED Notes (Signed)
Patient continues to be resting peaceful with eyes closed and respirations even and non labored. Vitals WDL.

## 2019-07-18 NOTE — ED Notes (Signed)
Pt did not want to stay for discharge vitals

## 2019-07-18 NOTE — ED Provider Notes (Signed)
Patient reassessed.  Appears clinically sober.  Able to ambulate with steady gait.  Denies any SI or HI.  This point he believe he is clinically stable and appropriate for outpatient follow-up.  Have discussed with the patient and available family all diagnostics and treatments performed thus far and all questions were answered to the best of my ability. The patient demonstrates understanding and agreement with plan.    Merlyn Lot, MD 07/18/19 [phone]

## 2019-07-18 NOTE — ED Notes (Signed)
Pt awakened- pt gave verbal permission to give Hilda Blades an update

## 2019-07-18 NOTE — ED Notes (Signed)
Date and time results received: 07/18/19 0105 (use smartphrase ".now" to insert current time)  Test: ETOH  Critical Value: 372  Name of Provider Notified: EDP Owens Shark, R  Orders Received? Or Actions Taken?: NA
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
## 2019-07-18 NOTE — ED Provider Notes (Signed)
[facility] Emergency Department Provider Note   First MD Initiated Contact with Patient 07/18/19 0040     (approximate)  I have reviewed the triage vital signs and the nursing notes.  Level 5 caveat: History review of systems physical exam limited secondary to altered mental status most likely secondary to alcohol intoxication HISTORY  Chief Complaint Fall and Alcohol Intoxication   HPI Bill Villanueva is a 53 y.o. male with below list of previous medical conditions including alcohol and cocaine abuse, traumatic brain injury presents to the emergency department via EMS following accidental fall resultant head injury.  Patient admits to EtOH ingestion tonight.        Past Medical History:  Diagnosis Date   Anxiety    Bipolar 1 disorder (HCC)    Depression    Hypertension    Paranoid schizophrenia (HCC)    PTSD (post-traumatic stress disorder)    TBI (traumatic brain injury) [facility])     Patient Active Problem List   Diagnosis Date Noted   Esophageal obstruction due to food impaction    Tobacco use disorder 11/16/2017   Cocaine use disorder, moderate, dependence (HCC) 11/15/2017   Alcohol abuse 11/15/2017   Cannabis use disorder, moderate, dependence (HCC) 11/15/2017   PTSD (post-traumatic stress disorder) 11/15/2017    Past Surgical History:  Procedure Laterality Date   ESOPHAGOGASTRODUODENOSCOPY N/A 03/12/2018   Procedure: ESOPHAGOGASTRODUODENOSCOPY (EGD) with removal of food impaction;  Surgeon: Toney Reil, MD;  Location: [facility] ENDOSCOPY;  Service: Gastroenterology;  Laterality: N/A;    Prior to Admission medications   Medication Sig Start Date End Date Taking? Authorizing Provider  albuterol (PROVENTIL HFA) 108 (90 Base) MCG/ACT inhaler Inhale into the lungs. 05/29/15   [provider]  albuterol (PROVENTIL) (2.5 MG/3ML) 0.083% nebulizer solution Inhale into the lungs. 09/04/14   [provider]    baclofen (LIORESAL) 10 MG tablet Take 1 tablet (10 mg total) by mouth 3 (three) times daily. 10/29/18 10/29/19  Sherrie Mustache Roselyn Bering, PA-C  cyclobenzaprine (FLEXERIL) 10 MG tablet Take 1 tablet (10 mg total) by mouth 3 (three) times daily as needed. 10/04/18   Joni Reining, PA-C  diazepam (VALIUM) 5 MG tablet Take by mouth. 12/21/15   [provider]  docusate sodium (COLACE) 100 MG capsule Take by mouth.    [provider]  DULoxetine (CYMBALTA) 60 MG capsule Take 1 capsule (60 mg total) by mouth daily with breakfast. 11/20/17   Pucilowska, [name], MD  FLUoxetine (PROZAC) 20 MG capsule Take 1 capsule by mouth daily. 08/24/15   [provider]  fluticasone-salmeterol (ADVAIR HFA) 230-21 MCG/ACT inhaler Inhale into the lungs. 09/04/14   [provider]  hydrOXYzine (ATARAX/VISTARIL) 50 MG tablet Take 1 tablet (50 mg total) by mouth 3 (three) times daily as needed for anxiety. 11/20/17   Pucilowska, [name], MD  ipratropium-albuterol (DUONEB) 0.5-2.5 (3) MG/3ML SOLN Inhale into the lungs. 02/04/14   [provider]  lidocaine (XYLOCAINE) 5 % ointment Apply 5 grams to area up to twice daily 10/28/14   [provider]  lisinopril (PRINIVIL,ZESTRIL) 10 MG tablet Take 1 tablet by mouth daily. 08/24/15   [provider]  megestrol (MEGACE) 40 MG tablet Take by mouth. 08/24/15   [provider]  megestrol (MEGACE) 400 MG/10ML suspension Take 10 mLs (400 mg total) by mouth daily. Patient not taking: Reported on 03/12/2018 11/20/17   Pucilowska, Ellin Goodie, MD  omeprazole (PRILOSEC) 20 MG capsule Take by mouth. 05/29/15  [provider]  omeprazole (PRILOSEC) 40 MG capsule Take 1 capsule (40 mg total) by mouth 2 (two) times daily before a meal. 03/12/18 09/08/18  Vanga, Tally Due, MD  prazosin (MINIPRESS) 2 MG capsule Take 1 capsule (2 mg total) by mouth 2 (two) times daily. 11/20/17   Pucilowska, Wardell Honour, MD  predniSONE (STERAPRED  UNI-PAK 21 TAB) 10 MG (21) TBPK tablet Take 6 pills on day one then decrease by 1 pill each day 10/29/18   Versie Starks, PA-C  QUEtiapine (SEROQUEL) 300 MG tablet Take 1 tablet (300 mg total) by mouth at bedtime. 11/20/17   Pucilowska, Herma Ard B, MD  QUEtiapine (SEROQUEL) 50 MG tablet Take by mouth. 04/07/16   [provider]  traZODone (DESYREL) 50 MG tablet Take by mouth. 04/12/16   [provider]    Allergies Patient has no known allergies.  No family history on file.  Social History Social History   Tobacco Use   Smoking status: Heavy Tobacco Smoker    Packs/day: 0.50    Types: Cigarettes   Smokeless tobacco: Never Used  Substance Use Topics   Alcohol use: Yes    Comment: occ   Drug use: Yes    Types: Cocaine, Marijuana    Review of Systems Constitutional: No fever/chills Eyes: No visual changes. ENT: No sore throat. Cardiovascular: Denies chest pain. Respiratory: Denies shortness of breath. Gastrointestinal: No abdominal pain.  No nausea, no vomiting.  No diarrhea.  No constipation. Genitourinary: Negative for dysuria. Musculoskeletal: Negative for neck pain.  Negative for back pain. Integumentary: Negative for rash. Neurological: Negative for headaches, focal weakness or numbness. Psychiatric:  Positive for EtOH ingestion   ____________________________________________   PHYSICAL EXAM:  VITAL SIGNS: ED Triage Vitals  Enc Vitals Group     BP 07/18/19 0025 (!) 135/98     Pulse Rate 07/18/19 0025 70     Resp 07/18/19 0025 18     Temp 07/18/19 0028 97.6 F (36.4 C)     Temp Source 07/18/19 0028 Oral     SpO2 07/18/19 0025 97 %     Weight 07/18/19 0023 59 kg (130 lb)     Height 07/18/19 0023 1.702 m (5\' 7" )     Head Circumference --      Peak Flow --      Pain Score --      Pain Loc --      Pain Edu? --      Excl. in [place]? --     Constitutional: Alert and oriented to self, appears intoxicated Eyes: Conjunctivae are normal.  Head: 2  cm linear laceration left medial eyebrow. Ears:  Healthy appearing ear canals and TMs bilaterally Nose: Gross deformity of the nasal bridge with ecchymosis, evidence of epistaxis none at present Mouth/Throat: Patient is wearing a mask. Neck: No stridor.  No meningeal signs.   Cardiovascular: Normal rate, regular rhythm. Good peripheral circulation. Grossly normal heart sounds. Respiratory: Normal respiratory effort.  No retractions. Gastrointestinal: Soft and nontender. No distention.  Musculoskeletal: No lower extremity tenderness nor edema. No gross deformities of extremities. Neurologic: Slurred speech no gross focal neurologic deficits are appreciated.  Skin:  Skin is warm, dry and intact. Psychiatric: Appears intoxicated.  ____________________________________________   LABS (all labs ordered are listed, but only abnormal results are displayed)  Labs Reviewed  CBC - Abnormal; Notable for the following components:      Result Value   WBC 11.6 (*)    Platelets 403 (*)  All other components within normal limits  COMPREHENSIVE METABOLIC PANEL - Abnormal; Notable for the following components:   Potassium 3.4 (*)    Glucose, Bld 105 (*)    Calcium 8.8 (*)    All other components within normal limits  ETHANOL - Abnormal; Notable for the following components:   Alcohol, Ethyl (B) 372 (*)    All other components within normal limits   ________________________________________  RADIOLOGY I, [place] N Nallely Yost, personally viewed and evaluated these images (plain radiographs) as part of my medical decision making, as well as reviewing the written report by the radiologist.  ED MD interpretation: Nasal bone fracture on CT maxillofacial.  No intracranial or cervical pathology per radiology  Official radiology report(s): Ct Head Wo Contrast  Result Date: 07/18/2019 CLINICAL DATA:  Post head trauma, drinking and fall EXAM: CT HEAD WITHOUT CONTRAST TECHNIQUE: Contiguous axial images  were obtained from the base of the skull through the vertex without intravenous contrast. COMPARISON:  August 28, 2012 FINDINGS: Brain: No evidence of acute territorial infarction, hemorrhage, hydrocephalus,extra-axial collection or mass lesion/mass effect. Normal gray-white differentiation. Ventricles are normal in size and contour. Vascular: No hyperdense vessel or unexpected calcification. Skull: The skull is intact. There are bilateral comminuted nasal bone fractures as on the prior exam. There does appear to be new fracture seen at the anterior process of the nasal bone however. This is best seen on CT face below. Deviated septum is seen. Sinuses/Orbits: The visualized paranasal sinuses and mastoid air cells are clear. The orbits and globes intact. Other: None Face: Osseous: Again noted are bilateral comminuted nasal bone fractures as on the prior CT of 2013. There does however appear to be new anterior process nasal bone fracture. Significant overlying soft tissue swelling is noted. There is also a soft tissue hematoma seen overlying the inferior tip of the nose measuring approximately 3 cm in transverse dimension. There is a deviated nasal septum. No other acute fracture is seen. Orbits: No fracture identified. Unremarkable appearance of globes and orbits. Sinuses: Small left maxillary retention cyst is seen. The remainder of the visualized paranasal sinuses and mastoid air cells are unremarkable. Soft tissues:  No acute findings. Limited intracranial: No acute findings. Cervical spine: Alignment: Physiologic Skull base and vertebrae: Visualized skull base is intact. No atlanto-occipital dissociation. The vertebral body heights are well maintained. No fracture or pathologic osseous lesion seen. Soft tissues and spinal canal: The visualized paraspinal soft tissues are unremarkable. No prevertebral soft tissue swelling is seen. The spinal canal is grossly unremarkable, no large epidural collection or  significant canal narrowing. Disc levels: Mild disc height loss with disc osteophyte complexes seen at C5-C6. Upper chest: Bilateral apical centrilobular bullous changes are seen. Other: None IMPRESSION: 1. New nasal bone fractures involving the anterior process with overlying 3 cm soft tissue hematoma along the inferior nose. 2. Prior bilateral comminuted nasal bone fractures as on CT from 2013 3. No acute intracranial abnormality. 4.  No acute fracture or malalignment of the spine. 5. Extensive centrilobular emphysematous/bullous changes. Electronically Signed   By: Jonna ClarkBindu  Avutu M.D.   On: 07/18/2019 01:44   Ct Cervical Spine Wo Contrast  Result Date: 07/18/2019 CLINICAL DATA:  Post head trauma, drinking and fall EXAM: CT HEAD WITHOUT CONTRAST TECHNIQUE: Contiguous axial images were obtained from the base of the skull through the vertex without intravenous contrast. COMPARISON:  August 28, 2012 FINDINGS: Brain: No evidence of acute territorial infarction, hemorrhage, hydrocephalus,extra-axial collection or mass lesion/mass effect. Normal gray-white differentiation.  Ventricles are normal in size and contour. Vascular: No hyperdense vessel or unexpected calcification. Skull: The skull is intact. There are bilateral comminuted nasal bone fractures as on the prior exam. There does appear to be new fracture seen at the anterior process of the nasal bone however. This is best seen on CT face below. Deviated septum is seen. Sinuses/Orbits: The visualized paranasal sinuses and mastoid air cells are clear. The orbits and globes intact. Other: None Face: Osseous: Again noted are bilateral comminuted nasal bone fractures as on the prior CT of 2013. There does however appear to be new anterior process nasal bone fracture. Significant overlying soft tissue swelling is noted. There is also a soft tissue hematoma seen overlying the inferior tip of the nose measuring approximately 3 cm in transverse dimension. There is a  deviated nasal septum. No other acute fracture is seen. Orbits: No fracture identified. Unremarkable appearance of globes and orbits. Sinuses: Small left maxillary retention cyst is seen. The remainder of the visualized paranasal sinuses and mastoid air cells are unremarkable. Soft tissues:  No acute findings. Limited intracranial: No acute findings. Cervical spine: Alignment: Physiologic Skull base and vertebrae: Visualized skull base is intact. No atlanto-occipital dissociation. The vertebral body heights are well maintained. No fracture or pathologic osseous lesion seen. Soft tissues and spinal canal: The visualized paraspinal soft tissues are unremarkable. No prevertebral soft tissue swelling is seen. The spinal canal is grossly unremarkable, no large epidural collection or significant canal narrowing. Disc levels: Mild disc height loss with disc osteophyte complexes seen at C5-C6. Upper chest: Bilateral apical centrilobular bullous changes are seen. Other: None IMPRESSION: 1. New nasal bone fractures involving the anterior process with overlying 3 cm soft tissue hematoma along the inferior nose. 2. Prior bilateral comminuted nasal bone fractures as on CT from 2013 3. No acute intracranial abnormality. 4.  No acute fracture or malalignment of the spine. 5. Extensive centrilobular emphysematous/bullous changes. Electronically Signed   By: Jonna Clark M.D.   On: 07/18/2019 01:44   Ct Maxillofacial Wo Contrast  Result Date: 07/18/2019 CLINICAL DATA:  Post head trauma, drinking and fall EXAM: CT HEAD WITHOUT CONTRAST TECHNIQUE: Contiguous axial images were obtained from the base of the skull through the vertex without intravenous contrast. COMPARISON:  August 28, 2012 FINDINGS: Brain: No evidence of acute territorial infarction, hemorrhage, hydrocephalus,extra-axial collection or mass lesion/mass effect. Normal gray-white differentiation. Ventricles are normal in size and contour. Vascular: No hyperdense  vessel or unexpected calcification. Skull: The skull is intact. There are bilateral comminuted nasal bone fractures as on the prior exam. There does appear to be new fracture seen at the anterior process of the nasal bone however. This is best seen on CT face below. Deviated septum is seen. Sinuses/Orbits: The visualized paranasal sinuses and mastoid air cells are clear. The orbits and globes intact. Other: None Face: Osseous: Again noted are bilateral comminuted nasal bone fractures as on the prior CT of 2013. There does however appear to be new anterior process nasal bone fracture. Significant overlying soft tissue swelling is noted. There is also a soft tissue hematoma seen overlying the inferior tip of the nose measuring approximately 3 cm in transverse dimension. There is a deviated nasal septum. No other acute fracture is seen. Orbits: No fracture identified. Unremarkable appearance of globes and orbits. Sinuses: Small left maxillary retention cyst is seen. The remainder of the visualized paranasal sinuses and mastoid air cells are unremarkable. Soft tissues:  No acute findings. Limited intracranial: No acute  findings. Cervical spine: Alignment: Physiologic Skull base and vertebrae: Visualized skull base is intact. No atlanto-occipital dissociation. The vertebral body heights are well maintained. No fracture or pathologic osseous lesion seen. Soft tissues and spinal canal: The visualized paraspinal soft tissues are unremarkable. No prevertebral soft tissue swelling is seen. The spinal canal is grossly unremarkable, no large epidural collection or significant canal narrowing. Disc levels: Mild disc height loss with disc osteophyte complexes seen at C5-C6. Upper chest: Bilateral apical centrilobular bullous changes are seen. Other: None IMPRESSION: 1. New nasal bone fractures involving the anterior process with overlying 3 cm soft tissue hematoma along the inferior nose. 2. Prior bilateral comminuted nasal bone  fractures as on CT from 2013 3. No acute intracranial abnormality. 4.  No acute fracture or malalignment of the spine. 5. Extensive centrilobular emphysematous/bullous changes. Electronically Signed   By: Jonna Clark M.D.   On: 07/18/2019 01:44     Procedures   ____________________________________________   INITIAL IMPRESSION / MDM / ASSESSMENT AND PLAN / ED COURSE  As part of my medical decision making, I reviewed the following data within the electronic MEDICAL RECORD NUMBER  53 year old male presented with above-stated history and physical exam secondary to presumed alcohol intoxication was confirmed with an EtOH level of 372.  Concern for possible intracranial pathology given head injury and as such CT head was performed which revealed no acute intracranial abnormality.  CT maxillofacial and cervical spine also performed which revealed nasal bone fracture.  Patient very agitated noncompliant upon arrival to the emergency department and as such IV Ativan 1 mg and Benadryl 50 mg was given so evaluation could be completed.  ____________________________________________  FINAL CLINICAL IMPRESSION(S) / ED DIAGNOSES  Final diagnoses:  Alcoholic intoxication with complication (HCC)  Closed fracture of nasal bone, initial encounter  Facial laceration, initial encounter     MEDICATIONS GIVEN DURING THIS VISIT:  Medications  LORazepam (ATIVAN) injection 1 mg (1 mg Intravenous Given 07/18/19 0048)  diphenhydrAMINE (BENADRYL) injection 50 mg (50 mg Intravenous Given 07/18/19 0127)     ED Discharge Orders    None      *Please note:  Bill Villanueva was evaluated in Emergency Department on 07/18/2019 for the symptoms described in the history of present illness. He was evaluated in the context of the global COVID-19 pandemic, which necessitated consideration that the patient might be at risk for infection with the SARS-CoV-2 virus that causes COVID-19. Institutional protocols and  algorithms that pertain to the evaluation of patients at risk for COVID-19 are in a state of rapid change based on information released by regulatory bodies including the CDC and federal and state organizations. These policies and algorithms were followed during the patient's care in the ED.  Some ED evaluations and interventions may be delayed as a result of limited staffing during the pandemic.*  Note:  This document was prepared using Dragon voice recognition software and may include unintentional dictation errors.   Darci Current, MD 07/18/19 [phone]

## 2019-07-18 NOTE — ED Notes (Signed)
Patient appears to be sleeping with eyes closed. Respirations even and non labored. Will continue to monitor.  

## 2019-12-23 ENCOUNTER — Encounter: Payer: Self-pay | Admitting: Radiology

## 2019-12-23 ENCOUNTER — Emergency Department: Payer: Medicaid Other

## 2019-12-23 ENCOUNTER — Inpatient Hospital Stay
Admission: EM | Admit: 2019-12-23 | Discharge: 2019-12-25 | DRG: 814 | Disposition: A | Discharge status: Home / Self Care | Payer: Medicaid Other | Attending: Surgery | Admitting: Surgery

## 2019-12-23 ENCOUNTER — Other Ambulatory Visit: Payer: Self-pay

## 2019-12-23 DIAGNOSIS — I10 Essential (primary) hypertension: Secondary | ICD-10-CM | POA: Diagnosis present

## 2019-12-23 DIAGNOSIS — Z20822 Contact with and (suspected) exposure to covid-19: Secondary | ICD-10-CM | POA: Diagnosis present

## 2019-12-23 DIAGNOSIS — K661 Hemoperitoneum: Secondary | ICD-10-CM | POA: Diagnosis not present

## 2019-12-23 DIAGNOSIS — F200 Paranoid schizophrenia: Secondary | ICD-10-CM | POA: Diagnosis present

## 2019-12-23 DIAGNOSIS — Z87820 Personal history of traumatic brain injury: Secondary | ICD-10-CM | POA: Diagnosis not present

## 2019-12-23 DIAGNOSIS — S36039S Unspecified laceration of spleen, sequela: Secondary | ICD-10-CM | POA: Diagnosis not present

## 2019-12-23 DIAGNOSIS — Z79899 Other long term (current) drug therapy: Secondary | ICD-10-CM

## 2019-12-23 DIAGNOSIS — Y92000 Kitchen of unspecified non-institutional (private) residence as  the place of occurrence of the external cause: Secondary | ICD-10-CM | POA: Diagnosis not present

## 2019-12-23 DIAGNOSIS — F419 Anxiety disorder, unspecified: Secondary | ICD-10-CM | POA: Diagnosis present

## 2019-12-23 DIAGNOSIS — Z79891 Long term (current) use of opiate analgesic: Secondary | ICD-10-CM

## 2019-12-23 DIAGNOSIS — F319 Bipolar disorder, unspecified: Secondary | ICD-10-CM | POA: Diagnosis present

## 2019-12-23 DIAGNOSIS — F4310 Post-traumatic stress disorder, unspecified: Secondary | ICD-10-CM | POA: Diagnosis present

## 2019-12-23 DIAGNOSIS — X58XXXA Exposure to other specified factors, initial encounter: Secondary | ICD-10-CM | POA: Diagnosis present

## 2019-12-23 DIAGNOSIS — F17210 Nicotine dependence, cigarettes, uncomplicated: Secondary | ICD-10-CM | POA: Diagnosis present

## 2019-12-23 DIAGNOSIS — S36039A Unspecified laceration of spleen, initial encounter: Principal | ICD-10-CM | POA: Diagnosis present

## 2019-12-23 LAB — URINALYSIS, ROUTINE W REFLEX MICROSCOPIC
Bacteria, UA: NONE SEEN
Bilirubin Urine: NEGATIVE
Glucose, UA: NEGATIVE mg/dL
Hgb urine dipstick: NEGATIVE
Ketones, ur: NEGATIVE mg/dL
Nitrite: NEGATIVE
Protein, ur: NEGATIVE mg/dL
Specific Gravity, Urine: >1.046 — ABNORMAL HIGH (ref 1.005–1.030)
Squamous Epithelial / HPF: NONE SEEN (ref 0–5)
pH: 5 (ref 5.0–8.0)

## 2019-12-23 LAB — CBC WITH DIFFERENTIAL/PLATELET
Abs Immature Granulocytes: 0.06 10*3/uL (ref 0.00–0.07)
Basophils Absolute: 0.1 10*3/uL (ref 0.0–0.1)
Basophils Relative: 1 %
Eosinophils Absolute: 0.3 10*3/uL (ref 0.0–0.5)
Eosinophils Relative: 2 %
HCT: 39.9 % (ref 39.0–52.0)
Hemoglobin: 13.2 g/dL (ref 13.0–17.0)
Immature Granulocytes: 0 %
Lymphocytes Relative: 20 %
Lymphs Abs: 2.7 10*3/uL (ref 0.7–4.0)
MCH: 32.3 pg (ref 26.0–34.0)
MCHC: 33.1 g/dL (ref 30.0–36.0)
MCV: 97.6 fL (ref 80.0–100.0)
Monocytes Absolute: 0.9 10*3/uL (ref 0.1–1.0)
Monocytes Relative: 7 %
Neutro Abs: 9.4 10*3/uL — ABNORMAL HIGH (ref 1.7–7.7)
Neutrophils Relative %: 70 %
Platelets: 429 10*3/uL — ABNORMAL HIGH (ref 150–400)
RBC: 4.09 MIL/uL — ABNORMAL LOW (ref 4.22–5.81)
RDW: 12.5 % (ref 11.5–15.5)
WBC: 13.4 10*3/uL — ABNORMAL HIGH (ref 4.0–10.5)
nRBC: 0 % (ref 0.0–0.2)

## 2019-12-23 LAB — COMPREHENSIVE METABOLIC PANEL
ALT: 17 U/L (ref 0–44)
AST: 24 U/L (ref 15–41)
Albumin: 4.6 g/dL (ref 3.5–5.0)
Alkaline Phosphatase: 69 U/L (ref 38–126)
Anion gap: 8 (ref 5–15)
BUN: 20 mg/dL (ref 6–20)
CO2: 26 mmol/L (ref 22–32)
Calcium: 9.2 mg/dL (ref 8.9–10.3)
Chloride: 106 mmol/L (ref 98–111)
Creatinine, Ser: 0.97 mg/dL (ref 0.61–1.24)
GFR calc Af Amer: >60 mL/min (ref >60)
GFR calc non Af Amer: >60 mL/min (ref >60)
Glucose, Bld: 136 mg/dL — ABNORMAL HIGH (ref 70–99)
Potassium: 4.3 mmol/L (ref 3.5–5.1)
Sodium: 140 mmol/L (ref 135–145)
Total Bilirubin: 0.9 mg/dL (ref 0.3–1.2)
Total Protein: 7.6 g/dL (ref 6.5–8.1)

## 2019-12-23 LAB — URINE DRUG SCREEN, QUALITATIVE (ARMC ONLY)
Amphetamines, Ur Screen: POSITIVE — AB
Barbiturates, Ur Screen: NOT DETECTED
Benzodiazepine, Ur Scrn: POSITIVE — AB
Cannabinoid 50 Ng, Ur ~~LOC~~: POSITIVE — AB
Cocaine Metabolite,Ur ~~LOC~~: POSITIVE — AB
MDMA (Ecstasy)Ur Screen: NOT DETECTED
Methadone Scn, Ur: NOT DETECTED
Opiate, Ur Screen: POSITIVE — AB
Phencyclidine (PCP) Ur S: NOT DETECTED
Tricyclic, Ur Screen: NOT DETECTED

## 2019-12-23 LAB — TYPE AND SCREEN
ABO/RH(D): O POS
Antibody Screen: NEGATIVE

## 2019-12-23 LAB — FIBRIN DERIVATIVES D-DIMER (ARMC ONLY): Fibrin derivatives D-dimer (ARMC): 1044.3 ng/mL (FEU) — ABNORMAL HIGH (ref 0.00–499.00)

## 2019-12-23 LAB — ETHANOL: Alcohol, Ethyl (B): <10 mg/dL (ref <10)

## 2019-12-23 LAB — PROTIME-INR
INR: 0.9 (ref 0.8–1.2)
Prothrombin Time: 12.2 seconds (ref 11.4–15.2)

## 2019-12-23 LAB — LIPASE, BLOOD: Lipase: 30 U/L (ref 11–51)

## 2019-12-23 LAB — TROPONIN I (HIGH SENSITIVITY): Troponin I (High Sensitivity): 4 ng/L (ref <18)

## 2019-12-23 LAB — HEMOGLOBIN AND HEMATOCRIT, BLOOD
HCT: 35.9 % — ABNORMAL LOW (ref 39.0–52.0)
HCT: 39.8 % (ref 39.0–52.0)
HCT: 35 % — ABNORMAL LOW (ref 39.0–52.0)
Hemoglobin: 11.7 g/dL — ABNORMAL LOW (ref 13.0–17.0)
Hemoglobin: 13.1 g/dL (ref 13.0–17.0)
Hemoglobin: 11.5 g/dL — ABNORMAL LOW (ref 13.0–17.0)

## 2019-12-23 LAB — RESPIRATORY PANEL BY RT PCR (FLU A&B, COVID)
Influenza A by PCR: NEGATIVE
Influenza B by PCR: NEGATIVE
SARS Coronavirus 2 by RT PCR: NEGATIVE

## 2019-12-23 LAB — HIV ANTIBODY (ROUTINE TESTING W REFLEX): HIV Screen 4th Generation wRfx: NONREACTIVE

## 2019-12-23 LAB — POC SARS CORONAVIRUS 2 AG: SARS Coronavirus 2 Ag: NEGATIVE

## 2019-12-23 LAB — APTT: aPTT: 31 seconds (ref 24–36)

## 2019-12-23 LAB — BRAIN NATRIURETIC PEPTIDE: B Natriuretic Peptide: 18 pg/mL (ref 0.0–100.0)

## 2019-12-23 MED ORDER — ONDANSETRON HCL 4 MG/2ML IJ SOLN: 4.0000 mg | Freq: Four times a day (QID) | INTRAMUSCULAR | Status: DC | PRN

## 2019-12-23 MED ORDER — POLYETHYLENE GLYCOL 3350 17 G PO PACK
17.0000 g | PACK | Freq: Every day | ORAL | Status: DC
  Administered 2019-12-23 – 2019-12-25 (×3): 17 g via ORAL
  Filled 2019-12-23 (×3): qty 1

## 2019-12-23 MED ORDER — NICOTINE 14 MG/24HR TD PT24
14.0000 mg | MEDICATED_PATCH | Freq: Every day | TRANSDERMAL | Status: DC
  Administered 2019-12-23 – 2019-12-25 (×3): 14 mg via TRANSDERMAL
  Filled 2019-12-23 (×3): qty 1

## 2019-12-23 MED ORDER — ONDANSETRON 4 MG PO TBDP: 4.0000 mg | ORAL_TABLET | Freq: Four times a day (QID) | ORAL | Status: DC | PRN

## 2019-12-23 MED ORDER — DROPERIDOL 2.5 MG/ML IJ SOLN
2.5000 mg | Freq: Once | INTRAMUSCULAR | Status: DC
  Filled 2019-12-23: qty 2

## 2019-12-23 MED ORDER — LACTATED RINGERS IV SOLN: INTRAVENOUS | Status: DC

## 2019-12-23 MED ORDER — MORPHINE SULFATE (PF) 2 MG/ML IV SOLN: 2.0000 mg | INTRAVENOUS | Status: DC | PRN

## 2019-12-23 MED ORDER — HYDROCODONE-ACETAMINOPHEN 5-325 MG PO TABS
1.0000 | ORAL_TABLET | ORAL | Status: DC | PRN
  Administered 2019-12-23 – 2019-12-24 (×3): 2 via ORAL
  Administered 2019-12-24: 16:00:00 1 via ORAL
  Administered 2019-12-24 – 2019-12-25 (×3): 2 via ORAL
  Filled 2019-12-23 (×7): qty 2

## 2019-12-23 MED ORDER — PANTOPRAZOLE SODIUM 40 MG IV SOLR
40.0000 mg | Freq: Every day | INTRAVENOUS | Status: DC
  Administered 2019-12-23 – 2019-12-24 (×2): 40 mg via INTRAVENOUS
  Filled 2019-12-23 (×2): qty 40

## 2019-12-23 MED ORDER — TRANEXAMIC ACID-NACL 1000-0.7 MG/100ML-% IV SOLN
1000.0000 mg | INTRAVENOUS | Status: AC
  Administered 2019-12-23: 1000 mg via INTRAVENOUS
  Filled 2019-12-23: qty 100

## 2019-12-23 MED ORDER — IOHEXOL 300 MG/ML  SOLN
100.0000 mL | Freq: Once | INTRAMUSCULAR | Status: AC | PRN
  Administered 2019-12-23: 100 mL via INTRAVENOUS

## 2019-12-23 NOTE — ED Notes (Signed)
Resumed care from [facility] rn.  Pt alert.  Sinus brady on monitor.  Iv in place.

## 2019-12-23 NOTE — ED Notes (Signed)
Patient resting quietly with eyes closed, easily aroused for lab draw.

## 2019-12-23 NOTE — ED Provider Notes (Addendum)
[facility] Emergency Department Provider Note  ____________________________________________   First MD Initiated Contact with Patient 12/23/19 0234     (approximate)  I have reviewed the triage vital signs and the nursing notes.   HISTORY  Chief Complaint Shortness of Breath  Level 5 caveat:  history/ROS limited by acute/critical illness  HPI Bill Villanueva is a 54 y.o. male with medical and psychiatric history as listed below who presents by EMS for evaluation of acute onset pain in his left side and difficulty breathing.  He is extremely agitated upon arrival, yelling that he cannot breathe but with a loud and clear voice and an oxygenation of 99%.  He is not able to provide much additional detail initially but after he calmed down he stated that he was standing at the stove cooking when all of a sudden he was hit with a sharp stabbing pain beneath the left side of his ribs.  He has had no traumatic injury of which he is aware.  He has no history of kidney stones.  He has not had any alcohol tonight although he does drink and he says that he used to do drugs but he no longer does so.  He has no history of lung disease of which he is aware.  He denies contact with known COVID-19 patients.  He denies sore throat, chest pain, vomiting, and abdominal pain, although he had severe nausea initially.   He reports that the symptoms are severe nothing is helping.        Past Medical History:  Diagnosis Date  . Anxiety   . Bipolar 1 disorder (HCC)   . Depression   . Hypertension   . Paranoid schizophrenia (HCC)   . PTSD (post-traumatic stress disorder)   . TBI (traumatic brain injury) [facility])     Patient Active Problem List   Diagnosis Date Noted  . Esophageal obstruction due to food impaction   . Tobacco use disorder 11/16/2017  . Cocaine use disorder, moderate, dependence (HCC) 11/15/2017  . Alcohol abuse 11/15/2017  . Cannabis use disorder, moderate,  dependence (HCC) 11/15/2017  . PTSD (post-traumatic stress disorder) 11/15/2017    Past Surgical History:  Procedure Laterality Date  . ESOPHAGOGASTRODUODENOSCOPY N/A 03/12/2018   Procedure: ESOPHAGOGASTRODUODENOSCOPY (EGD) with removal of food impaction;  Surgeon: Toney Reil, MD;  Location: [facility] ENDOSCOPY;  Service: Gastroenterology;  Laterality: N/A;    Prior to Admission medications   Medication Sig Start Date End Date Taking? Authorizing Provider  albuterol (PROVENTIL HFA) 108 (90 Base) MCG/ACT inhaler Inhale into the lungs. 05/29/15   [provider]  albuterol (PROVENTIL) (2.5 MG/3ML) 0.083% nebulizer solution Inhale into the lungs. 09/04/14   [provider]  cyclobenzaprine (FLEXERIL) 10 MG tablet Take 1 tablet (10 mg total) by mouth 3 (three) times daily as needed. Patient not taking: Reported on 12/23/2019 10/04/18   Joni Reining, PA-C  diazepam (VALIUM) 5 MG tablet Take by mouth. 12/21/15   [provider]  docusate sodium (COLACE) 100 MG capsule Take by mouth.    [provider]  DULoxetine (CYMBALTA) 60 MG capsule Take 1 capsule (60 mg total) by mouth daily with breakfast. Patient not taking: Reported on 12/23/2019 11/20/17   Pucilowska, Braulio Conte B, MD  FLUoxetine (PROZAC) 20 MG capsule Take 1 capsule by mouth daily. 08/24/15   [provider]  fluticasone-salmeterol (ADVAIR HFA) 230-21 MCG/ACT inhaler Inhale into the lungs. 09/04/14   [provider]  hydrOXYzine (ATARAX/VISTARIL) 50 MG  tablet Take 1 tablet (50 mg total) by mouth 3 (three) times daily as needed for anxiety. Patient not taking: Reported on 12/23/2019 11/20/17   Pucilowska, Braulio Conte B, MD  ipratropium-albuterol (DUONEB) 0.5-2.5 (3) MG/3ML SOLN Inhale into the lungs. 02/04/14   [provider]  lidocaine (XYLOCAINE) 5 % ointment Apply 5 grams to area up to twice daily 10/28/14   [provider]  lisinopril (PRINIVIL,ZESTRIL) 10 MG tablet Take 1  tablet by mouth daily. 08/24/15   [provider]  megestrol (MEGACE) 40 MG tablet Take by mouth. 08/24/15   [provider]  megestrol (MEGACE) 400 MG/10ML suspension Take 10 mLs (400 mg total) by mouth daily. Patient not taking: Reported on 03/12/2018 11/20/17   Pucilowska, Ellin Goodie, MD  omeprazole (PRILOSEC) 20 MG capsule Take by mouth. 05/29/15   [provider]  omeprazole (PRILOSEC) 40 MG capsule Take 1 capsule (40 mg total) by mouth 2 (two) times daily before a meal. 03/12/18 09/08/18  Vanga, Loel Dubonnet, MD  prazosin (MINIPRESS) 2 MG capsule Take 1 capsule (2 mg total) by mouth 2 (two) times daily. Patient not taking: Reported on 12/23/2019 11/20/17   Pucilowska, Ellin Goodie, MD  predniSONE (STERAPRED UNI-PAK 21 TAB) 10 MG (21) TBPK tablet Take 6 pills on day one then decrease by 1 pill each day Patient not taking: Reported on 12/23/2019 10/29/18   Faythe Ghee, PA-C  QUEtiapine (SEROQUEL) 300 MG tablet Take 1 tablet (300 mg total) by mouth at bedtime. Patient not taking: Reported on 12/23/2019 11/20/17   Pucilowska, Ellin Goodie, MD  QUEtiapine (SEROQUEL) 50 MG tablet Take by mouth. 04/07/16   [provider]  traZODone (DESYREL) 50 MG tablet Take by mouth. 04/12/16   [provider]    Allergies Patient has no known allergies.  No family history on file.  Social History Social History   Tobacco Use  . Smoking status: Heavy Tobacco Smoker    Packs/day: 0.50    Types: Cigarettes  . Smokeless tobacco: Never Used  Substance Use Topics  . Alcohol use: Yes    Comment: occ  . Drug use: Yes    Types: Cocaine, Marijuana    Review of Systems Level 5 caveat:  history/ROS limited by acute/critical illness   Constitutional: No fever/chills Eyes: No visual changes. ENT: No sore throat. Cardiovascular: Pain in the left side of his ribs, difficult to appreciate whether this is chest pain or flank pain. Respiratory: +shortness of  breath. Gastrointestinal: No abdominal pain.  No nausea, no vomiting.  No diarrhea.  No constipation. Genitourinary: Negative for dysuria. Musculoskeletal: Pain in the left side of his ribs, difficult to appreciate whether it is flank pain or chest pain. Integumentary: Negative for rash. Neurological: Negative for headaches, focal weakness or numbness.   ____________________________________________   PHYSICAL EXAM:  VITAL SIGNS: ED Triage Vitals  Enc Vitals Group     BP 12/23/19 0228 (!) 160/88     Pulse Rate 12/23/19 0228 61     Resp 12/23/19 0228 18     Temp 12/23/19 0228 97.8 F (36.6 C)     Temp Source 12/23/19 0228 Axillary     SpO2 12/23/19 0228 99 %     Weight 12/23/19 0221 65.8 kg (145 lb)     Height 12/23/19 0221 1.753 m (5\' 9" )     Head Circumference --      Peak Flow --      Pain Score 12/23/19 0221 10     Pain  Loc --      Pain Edu? --      Excl. in GC? --     Constitutional: Alert and oriented.  In severe and extreme distress upon arrival but without any intervention quieted down and is now resting quietly. Eyes: Conjunctivae are normal.  Head: Atraumatic. Nose: No congestion/rhinnorhea. Mouth/Throat: Patient is wearing a mask. Neck: No stridor.  No meningeal signs.   Cardiovascular: Normal rate, regular rhythm. Good peripheral circulation. Grossly normal heart sounds. Respiratory: Normal respiratory effort.  No retractions. Gastrointestinal: Soft and nontender. No distention.  Musculoskeletal: Severe tenderness to palpation of the left lateral ribs including the flank.  No lower extremity tenderness nor edema. No gross deformities of extremities. Neurologic:  Normal speech and language. No gross focal neurologic deficits are appreciated.  Skin:  Skin is warm, dry and intact. Psychiatric: Mood and affect are normal. Speech and behavior are normal.  ____________________________________________   LABS (all labs ordered are listed, but only abnormal results  are displayed)  Labs Reviewed  CBC WITH DIFFERENTIAL/PLATELET - Abnormal; Notable for the following components:      Result Value   WBC 13.4 (*)    RBC 4.09 (*)    Platelets 429 (*)    Neutro Abs 9.4 (*)    All other components within normal limits  COMPREHENSIVE METABOLIC PANEL - Abnormal; Notable for the following components:   Glucose, Bld 136 (*)    All other components within normal limits  FIBRIN DERIVATIVES D-DIMER ([facility] ONLY) - Abnormal; Notable for the following components:   Fibrin derivatives D-dimer ([facility]) 1,044.30 (*)    All other components within normal limits  HEMOGLOBIN AND HEMATOCRIT, BLOOD - Abnormal; Notable for the following components:   Hemoglobin 11.7 (*)    HCT 35.9 (*)    All other components within normal limits  RESPIRATORY PANEL BY RT PCR (FLU A&B, COVID)  BRAIN NATRIURETIC PEPTIDE  LIPASE, BLOOD  ETHANOL  PROTIME-INR  APTT  URINALYSIS, ROUTINE W REFLEX MICROSCOPIC  URINE DRUG SCREEN, QUALITATIVE ([facility] ONLY)  HIV ANTIBODY (ROUTINE TESTING W REFLEX)  HEMOGLOBIN AND HEMATOCRIT, BLOOD  HEMOGLOBIN AND HEMATOCRIT, BLOOD  POC SARS CORONAVIRUS 2 AG -  ED  POC SARS CORONAVIRUS 2 AG  TYPE AND SCREEN  TROPONIN I (HIGH SENSITIVITY)   ____________________________________________  EKG  ED ECG REPORT I, Loleta Roseory Amire Gossen, the attending physician, personally viewed and interpreted this ECG.  Date: 12/23/2019 EKG Time: 2:21 AM Rate: 50 Rhythm: Sinus bradycardia QRS Axis: normal Intervals: Incomplete right bundle branch block ST/T Wave abnormalities: Non-specific ST segment / T-wave changes, but no clear evidence of acute ischemia. Narrative Interpretation: no definitive evidence of acute ischemia; does not meet STEMI criteria.   ____________________________________________  RADIOLOGY I, Loleta Roseory Starlette Thurow, personally viewed and evaluated these images (plain radiographs) as part of my medical decision making, as well as reviewing the written report by the  radiologist.  I also discussed both CT scans with the radiologist by phone.  ED MD interpretation: No evidence of pneumothorax, old posterior right rib fracture but nothing obvious on the left.   Noncontrast abdominal CT was concerning for fluid in the abdomen and the radiologist recommended that I obtain a contrasted study.  The contrasted study demonstrates a small splenic laceration with no active extravasation but moderate hemoperitoneum.  Official radiology report(s): CT ABDOMEN PELVIS W CONTRAST  Result Date: 12/23/2019 CLINICAL DATA:  Sudden onset left flank pain. EXAM: CT ABDOMEN AND PELVIS WITH CONTRAST TECHNIQUE: Multidetector CT imaging of the abdomen and  pelvis was performed using the standard protocol following bolus administration of intravenous contrast. CONTRAST:  OMNIPAQUE IOHEXOL 300 MG/ML  SOLN COMPARISON:  Noncontrast abdominal CT earlier today FINDINGS: Lower chest: Coronary atherosclerosis. Mild atelectasis in the lower lungs. Hepatobiliary: No focal liver abnormality.No evidence of biliary obstruction or stone. Pancreas: Unremarkable. Spleen: 3.6 cm area of non enhancement within the spleen without active extravasation. The defect traverses the capsule at the hilum. No lesion was seen on prior and there is no concerning enhancement for an underlying lesion today. There is a lack of trauma history and a splenic on infarct would be considered but there is no visible imaging cause for infarct either. There is perisplenic hematoma with fluid throughout the peritoneal space. Adrenals/Urinary Tract: Negative adrenals. No hydronephrosis or stone. Simple left renal cystic density. There is a new low-density in the posterior right kidney measuring 8 mm, more intermediate density but not visibly enhancing or de-enhancing between the 2 phases. Unremarkable bladder. Stomach/Bowel:  No obstruction. No evidence of bowel injury. Vascular/Lymphatic: No acute vascular abnormality. Prominent degree  of aortic atherosclerosis for age. No mass or adenopathy. Reproductive:No pathologic findings. Other: No ascites or pneumoperitoneum. Musculoskeletal: No acute abnormalities. Specifically no visible left-sided rib fractures. These results were called by telephone at the time of interpretation on 12/23/2019 at 5:27 am to provider [facility] , who verbally acknowledged these results. IMPRESSION: 1. Splenic laceration with moderate hemoperitoneum. No evidence of active hemorrhage. 2. 8 mm right renal lesion with indeterminate density between solid and complex cyst. Consider follow-up. Electronically Signed   By: Marnee Spring M.D.   On: 12/23/2019 05:27   DG Chest Portable 1 View  Result Date: 12/23/2019 CLINICAL DATA:  Shortness of breath EXAM: PORTABLE CHEST 1 VIEW COMPARISON:  08/28/2012 FINDINGS: Scarring in the left upper lobe. Right lung is clear. No pleural effusion or pneumothorax. The heart is normal in size. Old right posterior 9th rib fracture deformity. IMPRESSION: No evidence of acute cardiopulmonary disease. Electronically Signed   By: Charline Bills M.D.   On: 12/23/2019 02:35   CT Renal Stone Study  Result Date: 12/23/2019 CLINICAL DATA:  Severe right-sided abdominal pain EXAM: CT ABDOMEN AND PELVIS WITHOUT CONTRAST TECHNIQUE: Multidetector CT imaging of the abdomen and pelvis was performed following the standard protocol without IV contrast. COMPARISON:  08/28/2012 FINDINGS: Lower chest:  Coronary atherosclerosis, multifocal and age advanced. Hepatobiliary: No focal liver abnormality.No evidence of biliary obstruction or stone. Pancreas: Unremarkable. Spleen: Irregular appearance inferiorly which could be due to pathology or motion. Adrenals/Urinary Tract: Negative adrenals. No hydronephrosis or stone. Unremarkable bladder. Stomach/Bowel:  No obstruction or visible bowel wall thickening. Vascular/Lymphatic: No acute vascular abnormality. Upper back atherosclerotic calcification of the  aorta. No mass or adenopathy. Reproductive:No pathologic findings. Other: Small to moderate peritoneal fluid seen in both upper quadrants, flanks, and in the pelvis. Density is greater than simple are fluid, suggesting pneumoperitoneum. Musculoskeletal: No acute abnormalities. These results were called by telephone at the time of interpretation on 12/23/2019 at 4:24 am to provider [facility] , who verbally acknowledged these results. IMPRESSION: 1. Ascites which is moderately dense and could reflect hemoperitoneum. The spleen appears heterogeneous on a few slices but is affected by motion, recommend postcontrast imaging. 2. Aortic and coronary atherosclerosis, age advanced. Electronically Signed   By: Marnee Spring M.D.   On: 12/23/2019 04:25    ____________________________________________   PROCEDURES   Procedure(s) performed (including Critical Care):  .1-3 Lead EKG Interpretation Performed by: Loleta Rose, MD  Authorized by: Loleta Rose, MD     Interpretation: normal     ECG rate:  62   ECG rate assessment: normal     Rhythm: sinus rhythm     Ectopy: none     Conduction: normal    .Critical Care Performed by: Loleta Rose, MD Authorized by: Loleta Rose, MD   Critical care provider statement:    Critical care time (minutes):  30   Critical care time was exclusive of:  Separately billable procedures and treating other patients   Critical care was necessary to treat or prevent imminent or life-threatening deterioration of the following conditions: acute intraabdominal bleeding from splenic laceration resulting in moderate hemoperitoneum.   Critical care was time spent personally by me on the following activities:  Development of treatment plan with patient or surrogate, discussions with consultants, evaluation of patient's response to treatment, examination of patient, obtaining history from patient or surrogate, ordering and performing treatments and interventions, ordering  and review of laboratory studies, ordering and review of radiographic studies, pulse oximetry, re-evaluation of patient's condition and review of old charts     ____________________________________________   INITIAL IMPRESSION / MDM / ASSESSMENT AND PLAN / ED COURSE  As part of my medical decision making, I reviewed the following data within the electronic MEDICAL RECORD NUMBER Nursing notes reviewed and incorporated, Labs reviewed , EKG interpreted , Old chart reviewed, Discussed with admitting physician , Discussed with radiologist, A consult was requested and obtained from this/these consultant(s) Surgery, Notes from prior ED visits and [place] Controlled Substance Database   Differential diagnosis includes, but is not limited to, panic attack, pneumothorax, ureteral colic, COVID-19, pulmonary embolism.  The patient presented in extremis and was yelling about how he could not breathe and he was in pain but was taking deep breaths, speaking clearly and loudly, and yelling a great deal.  Without any intervention he settled down and drifted off to sleep but with normal oxygen saturation and normal vital signs.  Emergent chest x-ray revealed no evidence of pneumothorax which is reassuring.  When I assessed him shortly thereafter he was calm, cooperative, even pleasant to talk to.  He has highly reproducible left-sided tenderness on the chest wall and around the site to the flank.  Given his initial presentation I think it is likely that he may be suffering from ureteral colic.  I have added on a D-dimer since pulmonary embolism is also possible but he has bradycardia, not tachycardia, and no hypoxemia and no risk factors that I can discern.  He does not require medication at this time.  He agrees with the plan for further evaluation of possible kidney stone and will give a urine specimen to check for hematuria when he is able to do so.      Clinical Course as of Dec 22 608  Mon Dec 23, 2019  0424 I  discussed the case with the radiologist who called me to give me an update on the CT scan.  He said that the patient has fluid in the abdomen that could be hemoperitoneum, possibly from a splenic source.  He strongly encourage me to repeat the CT but with IV contrast.     [CF]  0427 SARS Coronavirus 2 by RT PCR: NEGATIVE [CF]  0518 Patient being scanned.  I have ordered a repeat hemoglobin and hematocrit which is about 3 hours after the original one was drawn so we can see if his hemoglobin is dropping.   [CF]  0530  Ordered TXA 1000mg  IV for acute bleeding.   [CF]  7096 The radiologist, Dr. Nevada Crane, called to discuss with me the results of the CT scan.  He said that there is a small splenic laceration with no active extravasation and a moderate amount of hemoperitoneum.I updated the patient.  I pressed him extensively about the possibility of any trauma recently and he adamantly denies it, and even expressed surprise that I would ask.  He continues to state that it occurred spontaneously while he was standing at the stove cooking.I will discussed the case with surgery to determine the appropriate disposition.  CT ABDOMEN PELVIS W CONTRAST [CF]  [id number] I discussed the case by phone with Dr. Christian Mate.  We discussed the case in detail and he will put in admission orders for serial H&Hs and observation to make sure the bleeding has stopped.  He agreed with my management thus far.   [CF]  2836 Coagulation studies and type and screen are complete.     [CF]  0610 Hb dropped from 13.2 to 11.7 in about 3 hours.  Hemoglobin(!): 11.7 [CF]    Clinical Course User Index [CF] Hinda Kehr, MD     ____________________________________________  FINAL CLINICAL IMPRESSION(S) / ED DIAGNOSES  Final diagnoses:  Splenic laceration, initial encounter  Hemoperitoneum, nontraumatic     MEDICATIONS GIVEN DURING THIS VISIT:  Medications  tranexamic acid (CYKLOKAPRON) IVPB 1,000 mg (has no administration in time  range)  iohexol (OMNIPAQUE) 300 MG/ML solution 100 mL (100 mLs Intravenous Contrast Given 12/23/19 0501)     ED Discharge Orders    None      *Please note:  Muadh Creasy was evaluated in Emergency Department on 12/23/2019 for the symptoms described in the history of present illness. He was evaluated in the context of the global COVID-19 pandemic, which necessitated consideration that the patient might be at risk for infection with the SARS-CoV-2 virus that causes COVID-19. Institutional protocols and algorithms that pertain to the evaluation of patients at risk for COVID-19 are in a state of rapid change based on information released by regulatory bodies including the CDC and federal and state organizations. These policies and algorithms were followed during the patient's care in the ED.  Some ED evaluations and interventions may be delayed as a result of limited staffing during the pandemic.*  Note:  This document was prepared using Dragon voice recognition software and may include unintentional dictation errors.   Hinda Kehr, MD 12/23/19 6294    Hinda Kehr, MD 12/23/19 [phone]

## 2019-12-23 NOTE — ED Notes (Signed)
Pt sleeping soundly.

## 2019-12-23 NOTE — ED Notes (Signed)
ED TO INPATIENT HANDOFF REPORT  ED Nurse Name and Phone #: Corrie Dandy [phone]  S Name/Age/Gender Bill Villanueva Neiss 54 y.o. male Room/Bed: ED16A/ED16A  Code Status   Code Status: Full Code  Home/SNF/Other Home Patient oriented to: Is this baseline? Yes   Triage Complete: Triage complete  Chief Complaint Splenic laceration [S36.039A]  Triage Note Patient to ED by EMS from home.  Per EMS girlfriend called them because patient was constantly moving around and saying he couldn't breath.  Patient has not had any medications for approximately a year.  EMS vital signs hr -- 50-60's, BP 178/102, pulse oxi 100T on room air.    Allergies No Known Allergies  Level of Care/Admitting Diagnosis ED Disposition    ED Disposition Condition Comment   Admit  Hospital Area: [facility] [[id number]]  Level of Care: Med-Surg [16]  Covid Evaluation: Asymptomatic Screening Protocol (No Symptoms)  Diagnosis: Splenic laceration [884166]  Admitting Physician: Campbell Lerner [[id number]]  Attending Physician: Campbell Lerner [[id number]]  Estimated length of stay: past midnight tomorrow  Certification:: I certify this patient will need inpatient services for at least 2 midnights       B Medical/Surgery History Past Medical History:  Diagnosis Date  . Anxiety   . Bipolar 1 disorder (HCC)   . Depression   . Hypertension   . Paranoid schizophrenia (HCC)   . PTSD (post-traumatic stress disorder)   . TBI (traumatic brain injury) [facility])    Past Surgical History:  Procedure Laterality Date  . ESOPHAGOGASTRODUODENOSCOPY N/A 03/12/2018   Procedure: ESOPHAGOGASTRODUODENOSCOPY (EGD) with removal of food impaction;  Surgeon: Toney Reil, MD;  Location: [facility] ENDOSCOPY;  Service: Gastroenterology;  Laterality: N/A;     A IV Location/Drains/Wounds Patient Lines/Drains/Airways Status   Active Line/Drains/Airways    Name:   Placement date:   Placement time:   Site:   Days:   Peripheral IV 12/23/19 Left Antecubital   12/23/19    0225    Antecubital   less than 1          Intake/Output Last 24 hours No intake or output data in the 24 hours ending 12/23/19 1444  Labs/Imaging Results for orders placed or performed during the hospital encounter of 12/23/19 (from the past 48 hour(s))  CBC with Differential/Platelet     Status: Abnormal   Collection Time: 12/23/19  2:24 AM  Result Value Ref Range   WBC 13.4 (H) 4.0 - 10.5 K/uL   RBC 4.09 (L) 4.22 - 5.81 MIL/uL   Hemoglobin 13.2 13.0 - 17.0 g/dL   HCT 35.5 73.2 - 20.2 %   MCV 97.6 80.0 - 100.0 fL   MCH 32.3 26.0 - 34.0 pg   MCHC 33.1 30.0 - 36.0 g/dL   RDW 54.2 70.6 - 23.7 %   Platelets 429 (H) 150 - 400 K/uL   nRBC 0.0 0.0 - 0.2 %   Neutrophils Relative % 70 %   Neutro Abs 9.4 (H) 1.7 - 7.7 K/uL   Lymphocytes Relative 20 %   Lymphs Abs 2.7 0.7 - 4.0 K/uL   Monocytes Relative 7 %   Monocytes Absolute 0.9 0.1 - 1.0 K/uL   Eosinophils Relative 2 %   Eosinophils Absolute 0.3 0.0 - 0.5 K/uL   Basophils Relative 1 %   Basophils Absolute 0.1 0.0 - 0.1 K/uL   Immature Granulocytes 0 %   Abs Immature Granulocytes 0.06 0.00 - 0.07 K/uL    Comment: Performed at [facility], [address] [place], [place] [address]  Comprehensive metabolic panel     Status: Abnormal   Collection Time: 12/23/19  2:24 AM  Result Value Ref Range   Sodium 140 135 - 145 mmol/L   Potassium 4.3 3.5 - 5.1 mmol/L   Chloride 106 98 - 111 mmol/L   CO2 26 22 - 32 mmol/L   Glucose, Bld 136 (H) 70 - 99 mg/dL    Comment: Glucose reference range applies only to samples taken after fasting for at least 8 hours.   BUN 20 6 - 20 mg/dL   Creatinine, Ser 2.58 0.61 - 1.24 mg/dL   Calcium 9.2 8.9 - 52.7 mg/dL   Total Protein 7.6 6.5 - 8.1 g/dL   Albumin 4.6 3.5 - 5.0 g/dL   AST 24 15 - 41 U/L   ALT 17 0 - 44 U/L   Alkaline Phosphatase 69 38 - 126 U/L   Total Bilirubin 0.9 0.3 - 1.2 mg/dL   GFR calc non Af Amer >60 >60 mL/min    GFR calc Af Amer >60 >60 mL/min   Anion gap 8 5 - 15    Comment: Performed at [facility], [address], [place], [place] [address]  Brain natriuretic peptide     Status: None   Collection Time: 12/23/19  2:24 AM  Result Value Ref Range   B Natriuretic Peptide 18.0 0.0 - 100.0 pg/mL    Comment: Performed at [facility], [address] [place], [place] [address]  Lipase, blood     Status: None   Collection Time: 12/23/19  2:24 AM  Result Value Ref Range   Lipase 30 11 - 51 U/L    Comment: Performed at [facility], [address] [place], [place] [address]  Ethanol     Status: None   Collection Time: 12/23/19  2:24 AM  Result Value Ref Range   Alcohol, Ethyl (B) <10 <10 mg/dL    Comment: (NOTE) Lowest detectable limit for serum alcohol is 10 mg/dL. For medical purposes only. Performed at [facility], [address] [place], [place] [address]   Troponin I (High Sensitivity)     Status: None   Collection Time: 12/23/19  2:24 AM  Result Value Ref Range   Troponin I (High Sensitivity) 4 <18 ng/L    Comment: (NOTE) Elevated high sensitivity troponin I (hsTnI) values and significant  changes across serial measurements may suggest ACS but many other  chronic and acute conditions are known to elevate hsTnI results.  Refer to the "Links" section for chest pain algorithms and additional  guidance. Performed at [facility], [address] [place], [place] [address]   POC SARS Coronavirus 2 Ag     Status: None   Collection Time: 12/23/19  3:13 AM  Result Value Ref Range   SARS Coronavirus 2 Ag NEGATIVE NEGATIVE    Comment: (NOTE) SARS-CoV-2 antigen NOT DETECTED.  Negative results are presumptive.  Negative results do not preclude SARS-CoV-2 infection and should not be used as the sole basis for treatment or other patient management decisions, including infection  control decisions, particularly in the presence of clinical  signs and  symptoms consistent with COVID-19, or in those who have been in contact with the virus.  Negative results must be combined with clinical observations, patient history, and epidemiological information. The expected result is Negative. Fact Sheet for Patients: https://sanders-williams.net/ Fact Sheet for Healthcare Providers: https://martinez.com/ This test is not yet approved or cleared by the  [place] [employer] and  has been authorized for detection and/or diagnosis of SARS-CoV-2 by FDA under an TEFL teacher (EUA).  This EUA will remain in effect (meaning this test can be used) for the duration of  the COVID-19 de claration under Section 564(b)(1) of the Act, 21 U.S.C. section 360bbb-3(b)(1), unless the authorization is terminated or revoked sooner.   Fibrin derivatives D-Dimer ([facility] only)     Status: Abnormal   Collection Time: 12/23/19  3:17 AM  Result Value Ref Range   Fibrin derivatives D-dimer ([facility]) 1,044.30 (H) 0.00 - 499.00 ng/mL (FEU)    Comment: (NOTE) <> Exclusion of Venous Thromboembolism (VTE) - OUTPATIENT ONLY   (Emergency Department or [place])   0-499 ng/ml (FEU): With a low to intermediate pretest probability                      for VTE this test result excludes the diagnosis                      of VTE.   >499 ng/ml (FEU) : VTE not excluded; additional work up for VTE is                      required. <> Testing on Inpatients and Evaluation of Disseminated Intravascular   Coagulation (DIC) Reference Range:   0-499 ng/ml (FEU) Performed at [facility], [address] [place], [place] [address]   Respiratory Panel by RT PCR (Flu A&B, Covid) - Nasopharyngeal Swab     Status: None   Collection Time: 12/23/19  3:17 AM   Specimen: Nasopharyngeal Swab  Result Value Ref Range   SARS Coronavirus 2 by RT PCR NEGATIVE NEGATIVE    Comment: (NOTE) SARS-CoV-2 target nucleic acids are NOT DETECTED. The  SARS-CoV-2 RNA is generally detectable in upper respiratoy specimens during the acute phase of infection. The lowest concentration of SARS-CoV-2 viral copies this assay can detect is 131 copies/mL. A negative result does not preclude SARS-Cov-2 infection and should not be used as the sole basis for treatment or other patient management decisions. A negative result may occur with  improper specimen collection/handling, submission of specimen other than nasopharyngeal swab, presence of viral mutation(s) within the areas targeted by this assay, and inadequate number of viral copies (<131 copies/mL). A negative result must be combined with clinical observations, patient history, and epidemiological information. The expected result is Negative. Fact Sheet for Patients:  https://www.moore.com/ Fact Sheet for Healthcare Providers:  https://www.young.biz/ This test is not yet ap proved or cleared by the [place] FDA and  has been authorized for detection and/or diagnosis of SARS-CoV-2 by FDA under an Emergency Use Authorization (EUA). This EUA will remain  in effect (meaning this test can be used) for the duration of the COVID-19 declaration under Section 564(b)(1) of the Act, 21 U.S.C. section 360bbb-3(b)(1), unless the authorization is terminated or revoked sooner.    Influenza A by PCR NEGATIVE NEGATIVE   Influenza B by PCR NEGATIVE NEGATIVE    Comment: (NOTE) The Xpert Xpress SARS-CoV-2/FLU/RSV assay is intended as an aid in  the diagnosis of influenza from Nasopharyngeal swab specimens and  should not be used as a sole basis for treatment. Nasal washings and  aspirates are unacceptable for Xpert Xpress SARS-CoV-2/FLU/RSV  testing. Fact Sheet for Patients: https://www.moore.com/ Fact Sheet for Healthcare Providers: https://www.young.biz/ This test is not yet approved or cleared by the [place]  and  has been authorized for detection and/or diagnosis of SARS-CoV-2 by  FDA under an Emergency Use Authorization (EUA). This EUA will remain  in effect (meaning this test can be used) for the duration of the  Covid-19 declaration under Section 564(b)(1) of the Act, 21  U.S.C. section 360bbb-3(b)(1), unless the authorization is  terminated or revoked. Performed at [facility], [address] [place], [place] [address]   Protime-INR     Status: None   Collection Time: 12/23/19  3:17 AM  Result Value Ref Range   Prothrombin Time 12.2 11.4 - 15.2 seconds   INR 0.9 0.8 - 1.2    Comment: (NOTE) INR goal varies based on device and disease states. Performed at [facility], [address] [place], [place] [address]   APTT     Status: None   Collection Time: 12/23/19  3:17 AM  Result Value Ref Range   aPTT 31 24 - 36 seconds    Comment: Performed at [facility], [address] [place], [place] [address]  Type and screen [facility]     Status: None   Collection Time: 12/23/19  4:34 AM  Result Value Ref Range   ABO/RH(D) O POS    Antibody Screen NEG    Sample Expiration      12/26/2019,2359 Performed at [facility], [address] [place], [place] [address]   Hemoglobin and hematocrit, blood     Status: Abnormal   Collection Time: 12/23/19  5:55 AM  Result Value Ref Range   Hemoglobin 11.7 (L) 13.0 - 17.0 g/dL   HCT 29.5 (L) 62.1 - 30.8 %    Comment: Performed at [facility], [address], [place], [place] [address]   CT ABDOMEN PELVIS W CONTRAST  Result Date: 12/23/2019 CLINICAL DATA:  Sudden onset left flank pain. EXAM: CT ABDOMEN AND PELVIS WITH CONTRAST TECHNIQUE: Multidetector CT imaging of the abdomen and pelvis was performed using the standard protocol following bolus administration of intravenous contrast. CONTRAST:  OMNIPAQUE IOHEXOL 300 MG/ML  SOLN COMPARISON:  Noncontrast abdominal CT  earlier today FINDINGS: Lower chest: Coronary atherosclerosis. Mild atelectasis in the lower lungs. Hepatobiliary: No focal liver abnormality.No evidence of biliary obstruction or stone. Pancreas: Unremarkable. Spleen: 3.6 cm area of non enhancement within the spleen without active extravasation. The defect traverses the capsule at the hilum. No lesion was seen on prior and there is no concerning enhancement for an underlying lesion today. There is a lack of trauma history and a splenic on infarct would be considered but there is no visible imaging cause for infarct either. There is perisplenic hematoma with fluid throughout the peritoneal space. Adrenals/Urinary Tract: Negative adrenals. No hydronephrosis or stone. Simple left renal cystic density. There is a new low-density in the posterior right kidney measuring 8 mm, more intermediate density but not visibly enhancing or de-enhancing between the 2 phases. Unremarkable bladder. Stomach/Bowel:  No obstruction. No evidence of bowel injury. Vascular/Lymphatic: No acute vascular abnormality. Prominent degree of aortic atherosclerosis for age. No mass or adenopathy. Reproductive:No pathologic findings. Other: No ascites or pneumoperitoneum. Musculoskeletal: No acute abnormalities. Specifically no visible left-sided rib fractures. These results were called by telephone at the time of interpretation on 12/23/2019 at 5:27 am to provider [facility] , who verbally acknowledged these results. IMPRESSION: 1. Splenic laceration with moderate hemoperitoneum. No evidence of active hemorrhage. 2. 8 mm right renal lesion with indeterminate density between solid and complex cyst. Consider follow-up. Electronically  Signed   By: Marnee SpringJonathon  Watts M.D.   On: 12/23/2019 05:27   DG Chest Portable 1 View  Result Date: 12/23/2019 CLINICAL DATA:  Shortness of breath EXAM: PORTABLE CHEST 1 VIEW COMPARISON:  08/28/2012 FINDINGS: Scarring in the left upper lobe. Right lung is clear. No  pleural effusion or pneumothorax. The heart is normal in size. Old right posterior 9th rib fracture deformity. IMPRESSION: No evidence of acute cardiopulmonary disease. Electronically Signed   By: Charline BillsSriyesh  Krishnan M.D.   On: 12/23/2019 02:35   CT Renal Stone Study  Result Date: 12/23/2019 CLINICAL DATA:  Severe right-sided abdominal pain EXAM: CT ABDOMEN AND PELVIS WITHOUT CONTRAST TECHNIQUE: Multidetector CT imaging of the abdomen and pelvis was performed following the standard protocol without IV contrast. COMPARISON:  08/28/2012 FINDINGS: Lower chest:  Coronary atherosclerosis, multifocal and age advanced. Hepatobiliary: No focal liver abnormality.No evidence of biliary obstruction or stone. Pancreas: Unremarkable. Spleen: Irregular appearance inferiorly which could be due to pathology or motion. Adrenals/Urinary Tract: Negative adrenals. No hydronephrosis or stone. Unremarkable bladder. Stomach/Bowel:  No obstruction or visible bowel wall thickening. Vascular/Lymphatic: No acute vascular abnormality. Upper back atherosclerotic calcification of the aorta. No mass or adenopathy. Reproductive:No pathologic findings. Other: Small to moderate peritoneal fluid seen in both upper quadrants, flanks, and in the pelvis. Density is greater than simple are fluid, suggesting pneumoperitoneum. Musculoskeletal: No acute abnormalities. These results were called by telephone at the time of interpretation on 12/23/2019 at 4:24 am to provider [facility]CORY FORBACH , who verbally acknowledged these results. IMPRESSION: 1. Ascites which is moderately dense and could reflect hemoperitoneum. The spleen appears heterogeneous on a few slices but is affected by motion, recommend postcontrast imaging. 2. Aortic and coronary atherosclerosis, age advanced. Electronically Signed   By: Marnee SpringJonathon  Watts M.D.   On: 12/23/2019 04:25    Pending Labs Unresulted Labs (From admission, onward)    Start     Ordered   12/24/19 0500  CBC  Tomorrow  morning,   STAT     12/23/19 0605   12/23/19 1800  Hemoglobin and hematocrit, blood  Once-Timed,   STAT     12/23/19 0705   12/23/19 1200  Hemoglobin and hematocrit, blood  Once-Timed,   STAT     12/23/19 0705   12/23/19 0559  HIV Antibody (routine testing w rflx)  (HIV Antibody (Routine testing w reflex) panel)  Once,   STAT     12/23/19 0605   12/23/19 0325  Urine Drug Screen, Qualitative ([facility] only)  Add-on,   AD     12/23/19 0324   12/23/19 0324  Urinalysis, Routine w reflex microscopic  ONCE - STAT,   STAT     12/23/19 0324          Vitals/Pain Today's Vitals   12/23/19 1345 12/23/19 1400 12/23/19 1415 12/23/19 1430  BP:  (!) 146/88  (!) 151/95  Pulse: (!) 53 (!) 54 (!) 56 64  Resp: 11 10 (!) 9 11  Temp:      TempSrc:      SpO2: 99% 97% 98% 98%  Weight:      Height:      PainSc:        Isolation Precautions No active isolations  Medications Medications  HYDROcodone-acetaminophen (NORCO/VICODIN) 5-325 MG per tablet 1-2 tablet (2 tablets Oral Given 12/23/19 1011)  morphine 2 MG/ML injection 2 mg (has no administration in time range)  polyethylene glycol (MIRALAX / GLYCOLAX) packet 17 g (17 g Oral Given 12/23/19 1010)  ondansetron (ZOFRAN-ODT) disintegrating tablet 4 mg (has no administration in time range)    Or  ondansetron (ZOFRAN) injection 4 mg (has no administration in time range)  pantoprazole (PROTONIX) injection 40 mg (has no administration in time range)  nicotine (NICODERM CQ - dosed in mg/24 hours) patch 14 mg (has no administration in time range)  lactated ringers infusion ( Intravenous New Bag/Given 12/23/19 1004)  iohexol (OMNIPAQUE) 300 MG/ML solution 100 mL (100 mLs Intravenous Contrast Given 12/23/19 0501)  tranexamic acid (CYKLOKAPRON) IVPB 1,000 mg (0 mg Intravenous Stopped 12/23/19 0614)    Mobility walks Low fall risk   Focused Assessments Cardiac Assessment Handoff:    No results found for: CKTOTAL, CKMB, CKMBINDEX, TROPONINI No results  found for: DDIMER Does the Patient currently have chest pain? No             R Recommendations: See Admitting Provider Note  Report given to:   Additional Notes:

## 2019-12-23 NOTE — H&P (Addendum)
Patient ID: Bill Villanueva, male   DOB: [birth year], 54 y.o.   MRN: [id number]  Chief Complaint: Left upper quadrant pain, acute onset.  History of Present Illness Bill Villanueva is a 54 y.o. male with acute onset of left upper quadrant/left lower chest pain..  Patient reports he was standing at the stove and had a sudden onset of a left-sided sharp stabbing pain of his lower left chest where he could not breathe.  He was quite agitated on arrival.  He denies any history of trauma.  Movement makes the pain worse.  Worse with deep inspiration.  Endorses prior history of substance abuse, but denies any recent consumption.  CT scan identifies a splenic lack with hemoperitoneum, but no evidence of active hemorrhage.  Prior CT reviewed from a decade ago showed no splenic abnormality.  Urine drug screen is pending.  TXA will be given in the ED. He reports nothing has helped his pain.    Past Medical History Past Medical History:  Diagnosis Date  . Anxiety   . Bipolar 1 disorder (HCC)   . Depression   . Hypertension   . Paranoid schizophrenia (HCC)   . PTSD (post-traumatic stress disorder)   . TBI (traumatic brain injury) [facility])       Past Surgical History:  Procedure Laterality Date  . ESOPHAGOGASTRODUODENOSCOPY N/A 03/12/2018   Procedure: ESOPHAGOGASTRODUODENOSCOPY (EGD) with removal of food impaction;  Surgeon: Toney Reil, MD;  Location: [facility] ENDOSCOPY;  Service: Gastroenterology;  Laterality: N/A;    No Known Allergies  Current Facility-Administered Medications  Medication Dose Route Frequency Provider Last Rate Last Admin  . tranexamic acid (CYKLOKAPRON) IVPB 1,000 mg  1,000 mg Intravenous STAT Loleta Rose, MD       Current Outpatient Medications  Medication Sig Dispense Refill  . albuterol (PROVENTIL HFA) 108 (90 Base) MCG/ACT inhaler Inhale into the lungs.    Marland Kitchen albuterol (PROVENTIL) (2.5 MG/3ML) 0.083% nebulizer solution Inhale into the lungs.    . cyclobenzaprine  (FLEXERIL) 10 MG tablet Take 1 tablet (10 mg total) by mouth 3 (three) times daily as needed. (Patient not taking: Reported on 12/23/2019) 15 tablet 0  . diazepam (VALIUM) 5 MG tablet Take by mouth.    . docusate sodium (COLACE) 100 MG capsule Take by mouth.    . DULoxetine (CYMBALTA) 60 MG capsule Take 1 capsule (60 mg total) by mouth daily with breakfast. (Patient not taking: Reported on 12/23/2019) 30 capsule 3  . FLUoxetine (PROZAC) 20 MG capsule Take 1 capsule by mouth daily.    . fluticasone-salmeterol (ADVAIR HFA) 230-21 MCG/ACT inhaler Inhale into the lungs.    . hydrOXYzine (ATARAX/VISTARIL) 50 MG tablet Take 1 tablet (50 mg total) by mouth 3 (three) times daily as needed for anxiety. (Patient not taking: Reported on 12/23/2019) 90 tablet 1  . ipratropium-albuterol (DUONEB) 0.5-2.5 (3) MG/3ML SOLN Inhale into the lungs.    . lidocaine (XYLOCAINE) 5 % ointment Apply 5 grams to area up to twice daily    . lisinopril (PRINIVIL,ZESTRIL) 10 MG tablet Take 1 tablet by mouth daily.    . megestrol (MEGACE) 40 MG tablet Take by mouth.    . megestrol (MEGACE) 400 MG/10ML suspension Take 10 mLs (400 mg total) by mouth daily. (Patient not taking: Reported on 03/12/2018) 240 mL 0  . omeprazole (PRILOSEC) 20 MG capsule Take by mouth.    Marland Kitchen omeprazole (PRILOSEC) 40 MG capsule Take 1 capsule (40 mg total) by mouth 2 (two) times daily before a  meal. 180 capsule 1  . prazosin (MINIPRESS) 2 MG capsule Take 1 capsule (2 mg total) by mouth 2 (two) times daily. (Patient not taking: Reported on 12/23/2019) 60 capsule 1  . predniSONE (STERAPRED UNI-PAK 21 TAB) 10 MG (21) TBPK tablet Take 6 pills on day one then decrease by 1 pill each day (Patient not taking: Reported on 12/23/2019) 21 tablet 0  . QUEtiapine (SEROQUEL) 300 MG tablet Take 1 tablet (300 mg total) by mouth at bedtime. (Patient not taking: Reported on 12/23/2019) 30 tablet 1  . QUEtiapine (SEROQUEL) 50 MG tablet Take by mouth.    . traZODone (DESYREL) 50 MG  tablet Take by mouth.      Family History No family history on file.    Social History Social History   Tobacco Use  . Smoking status: Heavy Tobacco Smoker    Packs/day: 0.50    Types: Cigarettes  . Smokeless tobacco: Never Used  Substance Use Topics  . Alcohol use: Yes    Comment: occ  . Drug use: Yes    Types: Cocaine, Marijuana        Review of Systems  Constitutional: Positive for malaise/fatigue. Negative for chills and fever.  HENT: Negative.   Eyes: Negative.   Respiratory: Positive for shortness of breath. Negative for hemoptysis and wheezing.   Cardiovascular: Positive for chest pain. Negative for palpitations.  Gastrointestinal: Negative for abdominal pain, blood in stool, melena, nausea and vomiting.  Genitourinary: Negative for dysuria, flank pain, frequency, hematuria and urgency.  Musculoskeletal: Negative for back pain, falls and joint pain.  Skin: Negative.   Neurological: Negative for weakness.  Endo/Heme/Allergies: Does not bruise/bleed easily.      Physical Exam Blood pressure (!) 160/88, pulse 61, temperature 97.8 F (36.6 C), temperature source Axillary, resp. rate 18, height 5\' 9"  (1.753 m), weight 65.8 kg, SpO2 99 %. Last Weight  Most recent update: 12/23/2019  2:22 AM   Weight  65.8 kg (145 lb)            CONSTITUTIONAL: Well developed, and nourished, appropriately responsive and aware without distress.   EYES: Sclera non-icteric.   EARS, NOSE, MOUTH AND THROAT: Mask worn.     Hearing is intact to voice.  NECK: Trachea is midline, and there is no jugular venous distension.  LYMPH NODES:  Lymph nodes in the neck are not enlarged. RESPIRATORY:  Lungs are clear, and breath sounds are equal bilaterally. Normal respiratory effort without pathologic use of accessory muscles. CARDIOVASCULAR: Heart is regular in rate and rhythm. GI: The abdomen is soft, nontender, and nondistended. There were no palpable masses. I did not appreciate  hepatosplenomegaly. There were normal bowel sounds. MUSCULOSKELETAL:  Symmetrical muscle tone appreciated in all four extremities.    SKIN: Skin turgor is normal. No pathologic skin lesions appreciated.  NEUROLOGIC:  Motor and sensation appear grossly normal.  Cranial nerves are grossly without defect. PSYCH:  Alert and oriented to person, place and time. Affect is appropriate for situation.  Data Reviewed I have personally reviewed what is currently available of the patient's imaging, recent labs and medical records.   Labs:  CBC Latest Ref Rng & Units 12/23/2019 07/18/2019 03/12/2018  WBC 4.0 - 10.5 K/uL 13.4(H) 11.6(H) 14.9(H)  Hemoglobin 13.0 - 17.0 g/dL 13.2 13.9 16.4  Hematocrit 39.0 - 52.0 % 39.9 41.6 48.3  Platelets 150 - 400 K/uL 429(H) 403(H) 405   CMP Latest Ref Rng & Units 12/23/2019 07/18/2019 03/12/2018  Glucose 70 - 99 mg/dL 136(H)  105(H) 139(H)  BUN 6 - 20 mg/dL 20 14 83(M)  Creatinine 0.61 - 1.24 mg/dL 6.29 4.76 5.46(T)  Sodium 135 - 145 mmol/L 140 136 146(H)  Potassium 3.5 - 5.1 mmol/L 4.3 3.4(L) 4.1  Chloride 98 - 111 mmol/L 106 104 108  CO2 22 - 32 mmol/L 26 23 23   Calcium 8.9 - 10.3 mg/dL 9.2 ) 0.3(T  Total Protein 6.5 - 8.1 g/dL 7.6 7.3 9.1(H)  Total Bilirubin 0.3 - 1.2 mg/dL 0.9 0.5 46.5)  Alkaline Phos 38 - 126 U/L 69 60 87  AST 15 - 41 U/L 24 22 29   ALT 0 - 44 U/L 17 16 22       Imaging:  Within last 24 hrs: CT ABDOMEN PELVIS W CONTRAST  Result Date: 12/23/2019 CLINICAL DATA:  Sudden onset left flank pain. EXAM: CT ABDOMEN AND PELVIS WITH CONTRAST TECHNIQUE: Multidetector CT imaging of the abdomen and pelvis was performed using the standard protocol following bolus administration of intravenous contrast. CONTRAST:  OMNIPAQUE IOHEXOL 300 MG/ML  SOLN COMPARISON:  Noncontrast abdominal CT earlier today FINDINGS: Lower chest: Coronary atherosclerosis. Mild atelectasis in the lower lungs. Hepatobiliary: No focal liver abnormality.No evidence of biliary  obstruction or stone. Pancreas: Unremarkable. Spleen: 3.6 cm area of non enhancement within the spleen without active extravasation. The defect traverses the capsule at the hilum. No lesion was seen on prior and there is no concerning enhancement for an underlying lesion today. There is a lack of trauma history and a splenic on infarct would be considered but there is no visible imaging cause for infarct either. There is perisplenic hematoma with fluid throughout the peritoneal space. Adrenals/Urinary Tract: Negative adrenals. No hydronephrosis or stone. Simple left renal cystic density. There is a new low-density in the posterior right kidney measuring 8 mm, more intermediate density but not visibly enhancing or de-enhancing between the 2 phases. Unremarkable bladder. Stomach/Bowel:  No obstruction. No evidence of bowel injury. Vascular/Lymphatic: No acute vascular abnormality. Prominent degree of aortic atherosclerosis for age. No mass or adenopathy. Reproductive:No pathologic findings. Other: No ascites or pneumoperitoneum. Musculoskeletal: No acute abnormalities. Specifically no visible left-sided rib fractures. These results were called by telephone at the time of interpretation on 12/23/2019 at 5:27 am to provider [facility] , who verbally acknowledged these results. IMPRESSION: 1. Splenic laceration with moderate hemoperitoneum. No evidence of active hemorrhage. 2. 8 mm right renal lesion with indeterminate density between solid and complex cyst. Consider follow-up. Electronically Signed   By: M.D.   On: 12/23/2019 05:27   DG Chest Portable 1 View  Result Date: 12/23/2019 CLINICAL DATA:  Shortness of breath EXAM: PORTABLE CHEST 1 VIEW COMPARISON:  08/28/2012 FINDINGS: Scarring in the left upper lobe. Right lung is clear. No pleural effusion or pneumothorax. The heart is normal in size. Old right posterior 9th rib fracture deformity. IMPRESSION: No evidence of acute cardiopulmonary  disease. Electronically Signed   By: 12/25/2019 M.D.   On: 12/23/2019 02:35   CT Renal Stone Study  Result Date: 12/23/2019 CLINICAL DATA:  Severe right-sided abdominal pain EXAM: CT ABDOMEN AND PELVIS WITHOUT CONTRAST TECHNIQUE: Multidetector CT imaging of the abdomen and pelvis was performed following the standard protocol without IV contrast. COMPARISON:  08/28/2012 FINDINGS: Lower chest:  Coronary atherosclerosis, multifocal and age advanced. Hepatobiliary: No focal liver abnormality.No evidence of biliary obstruction or stone. Pancreas: Unremarkable. Spleen: Irregular appearance inferiorly which could be due to pathology or motion. Adrenals/Urinary Tract: Negative adrenals. No hydronephrosis or stone. Unremarkable  bladder. Stomach/Bowel:  No obstruction or visible bowel wall thickening. Vascular/Lymphatic: No acute vascular abnormality. Upper back atherosclerotic calcification of the aorta. No mass or adenopathy. Reproductive:No pathologic findings. Other: Small to moderate peritoneal fluid seen in both upper quadrants, flanks, and in the pelvis. Density is greater than simple are fluid, suggesting pneumoperitoneum. Musculoskeletal: No acute abnormalities. These results were called by telephone at the time of interpretation on 12/23/2019 at 4:24 am to provider [facility] , who verbally acknowledged these results. IMPRESSION: 1. Ascites which is moderately dense and could reflect hemoperitoneum. The spleen appears heterogeneous on a few slices but is affected by motion, recommend postcontrast imaging. 2. Aortic and coronary atherosclerosis, age advanced. Electronically Signed   By: Marnee Spring M.D.   On: 12/23/2019 04:25    Assessment    Spontaneous splenic laceration with hemoperitoneum, without evidence of continuing hemorrhage. Patient Active Problem List   Diagnosis Date Noted  . Esophageal obstruction due to food impaction   . Tobacco use disorder 11/16/2017  . Cocaine use  disorder, moderate, dependence (HCC) 11/15/2017  . Alcohol abuse 11/15/2017  . Cannabis use disorder, moderate, dependence (HCC) 11/15/2017  . PTSD (post-traumatic stress disorder) 11/15/2017    Plan    Agree with utilization of TXA in ED. Follow-up urine drug screen. Worthy of admission for observation and serial H&H. Pain control, oxygen supplementation as needed. SCDs for DVT prophylaxis.  No indication for anticoagulation. GI prophylaxis.    Campbell Lerner M.D., FACS 12/23/2019, 5:48 AM

## 2019-12-23 NOTE — ED Triage Notes (Addendum)
Patient to ED by EMS from home.  Per EMS girlfriend called them because patient was constantly moving around and saying he couldn't breath.  Patient has not had any medications for approximately a year.  EMS vital signs hr -- 50-60's, BP 178/102, pulse oxi 100T on room air.

## 2019-12-23 NOTE — ED Notes (Signed)
Report called to olivia rn floor nurse 

## 2019-12-24 DIAGNOSIS — S36039S Unspecified laceration of spleen, sequela: Secondary | ICD-10-CM

## 2019-12-24 LAB — CBC
HCT: 36 % — ABNORMAL LOW (ref 39.0–52.0)
Hemoglobin: 12.1 g/dL — ABNORMAL LOW (ref 13.0–17.0)
MCH: 32.5 pg (ref 26.0–34.0)
MCHC: 33.6 g/dL (ref 30.0–36.0)
MCV: 96.8 fL (ref 80.0–100.0)
Platelets: 367 10*3/uL (ref 150–400)
RBC: 3.72 MIL/uL — ABNORMAL LOW (ref 4.22–5.81)
RDW: 12.6 % (ref 11.5–15.5)
WBC: 7.9 10*3/uL (ref 4.0–10.5)
nRBC: 0 % (ref 0.0–0.2)

## 2019-12-24 NOTE — TOC Initial Note (Signed)
Transition of Care [facility]) - Initial/Assessment Note    Patient Details  Name: Bill Villanueva MRN: [id number] Date of Birth: 01-01-1966  Transition of Care [facility]) CM/SW Contact:    Chapman Fitch, RN Phone Number: 12/24/2019, 1:53 PM  Clinical Narrative:                 Patient admitted from home with splenic laceration  Patient states that he lives at home with his girlfriend. States that he currently does not have income, and is pending disability.    Patient states they do not have their own car, but his neighbor is able to provide transportation as long as he has a days notice  TOC consult for medication assistance as patient states that his medicaid has lapsed.  Patient states a year ago he tried to pick up his medications and his Medicaid wouldn't process his medications.  Patient states after that one time he did not attempt to pick up his medications.   RNCM called and spoke directly to Medicaid.  Representative states that patient has full coverage Medicaid through September for 2021.  Representative states that patient has had this plan for a long time, and there has been no lapse in coverage.  Reference number for call ([phone]  Patient updated with this information he was very appreciative.  Patient states that he uses [facility].  Requested that scripts be electronically sent to pharmacy at discharge.  TOC will confirm at discharge that Medicaid was able to process  Patient states that he was previously active with [facility], however due to some moves has lost the contact information.  He would like to reestablish connection with [facility].  RNCM provided patient with resources.       Expected Discharge Plan: Home/Self Care Barriers to Discharge: Continued Medical Work up   Patient Goals and CMS Choice        Expected Discharge Plan and Services Expected Discharge Plan: Home/Self Care   Discharge Planning Services: CM Consult                                           Prior Living Arrangements/Services   Lives with:: Significant Other Patient language and need for interpreter reviewed:: No Do you feel safe going back to the place where you live?: Yes      Need for Family Participation in Patient Care: No (Comment) Care giver support system in place?: Yes (comment)   Criminal Activity/Legal Involvement Pertinent to Current Situation/Hospitalization: No - Comment as needed  Activities of Daily Living Home Assistive Devices/Equipment: Eyeglasses ADL Screening (condition at time of admission) Patient's cognitive ability adequate to safely complete daily activities?: Yes Is the patient deaf or have difficulty hearing?: No Does the patient have difficulty seeing, even when wearing glasses/contacts?: No Does the patient have difficulty concentrating, remembering, or making decisions?: No Patient able to express need for assistance with ADLs?: Yes Does the patient have difficulty dressing or bathing?: No Independently performs ADLs?: Yes (appropriate for developmental age) Does the patient have difficulty walking or climbing stairs?: No Weakness of Legs: None Weakness of Arms/Hands: None  Permission Sought/Granted                  Emotional Assessment Appearance:: Appears stated age     Orientation: : Oriented to Self, Oriented to Place, Oriented to  Time, Oriented to Situation Alcohol /  Substance Use: Illicit Drugs    Admission diagnosis:  Hemoperitoneum, nontraumatic [K66.1] Splenic laceration [S36.039A] Splenic laceration, initial encounter [S36.039A] Patient Active Problem List   Diagnosis Date Noted  . Splenic laceration 12/23/2019  . Esophageal obstruction due to food impaction   . Tobacco use disorder 11/16/2017  . Cocaine use disorder, moderate, dependence ([place]) 11/15/2017  . Alcohol abuse 11/15/2017  . Cannabis use disorder, moderate, dependence ([place]) 11/15/2017  . PTSD (post-traumatic stress disorder) 11/15/2017    PCP:  [facility] [place]:   [facility] [place], [address] [place] [address] Phone: [phone] Fax: [phone]  [id number] [address], [address] [place] [address] Phone: [phone] Fax: [phone]  [id number] [place], [place] - [address] [place] [address] Phone: [phone] Fax: [phone]     Social Determinants of Health (SDOH) Interventions    Readmission Risk Interventions No flowsheet data found.

## 2019-12-24 NOTE — Progress Notes (Addendum)
[facility] SURGICAL PROGRESS NOTE (cpt [id number])  Hospital Day(s): 1.   Interval History: Patient seen and examined, no acute events or new complaints overnight. Patient reports he still has some LUQ and left flank pain. This is a sharp achy pain. At worst is a 6/10. Still notices exacerbation with moving. No fever, chills, nausea, or emesis. He is asking for something to eat. H&H trend: 11.7 --> 13.1 --> 11.5 --> 12.1. Never require transfusion. No new complaints. UDS was positive for cocaine metabolites amongst others.   Review of Systems:  Constitutional: denies fever, chills  HEENT: denies cough or congestion  Respiratory: denies any shortness of breath  Cardiovascular: denies chest pain or palpitations  Gastrointestinal: + abdominal pain,denied  N/V, or diarrhea/and bowel function as per interval history Genitourinary: denies burning with urination or urinary frequency   Vital signs in last 24 hours: [min-max] current  Temp:  [97.6 F (36.4 C)-98.1 F (36.7 C)] 97.6 F (36.4 C) (03/29 2111) Pulse Rate:  [52-64] 55 (03/29 2111) Resp:  [9-18] 18 (03/29 2111) BP: (129-163)/(76-96) 129/84 (03/29 2111) SpO2:  [92 %-100 %] 99 % (03/29 2111)     Height: 5\' 9"  (175.3 cm) Weight: 65.8 kg BMI (Calculated): 21.4   Intake/Output last 2 shifts:  03/29 0701 - 03/30 0700 In: 1834 [I.V.:1834] Out: 675 [Urine:675]   Physical Exam:  Constitutional: alert, cooperative and no distress, thin appearing male HENT: normocephalic without obvious abnormality  Eyes: PERRL, EOM's grossly intact and symmetric  Respiratory: breathing non-labored at rest  Cardiovascular: regular rate and sinus rhythm  Gastrointestinal: Soft, LUQ tenderness and some left flank pain, and non-distended, no rebound/guarding Musculoskeletal: no edema or wounds, motor and sensation grossly intact, NT    Labs:  CBC Latest Ref Rng & Units 12/24/2019 12/23/2019 12/23/2019  WBC 4.0 - 10.5 K/uL 7.9 - -  Hemoglobin  13.0 - 17.0 g/dL 12.1(L) 11.5(L) 13.1  Hematocrit 39.0 - 52.0 % 36.0(L) 35.0(L) 39.8  Platelets 150 - 400 K/uL 367 - -   CMP Latest Ref Rng & Units 12/23/2019 07/18/2019 03/12/2018  Glucose 70 - 99 mg/dL 03/14/2018) 732(K) 025(K)  BUN 6 - 20 mg/dL 20 14 270(W)  Creatinine 0.61 - 1.24 mg/dL 23(J 6.28 3.15)  Sodium 135 - 145 mmol/L 140 136 146(H)  Potassium 3.5 - 5.1 mmol/L 4.3 3.4(L) 4.1  Chloride 98 - 111 mmol/L 106 104 108  CO2 22 - 32 mmol/L 26 23 23   Calcium 8.9 - 10.3 mg/dL 9.2 1.76(H)  Total Protein 6.5 - 8.1 g/dL 7.6 7.3 9.1(H)  Total Bilirubin 0.3 - 1.2 mg/dL 0.9 0.5 6.0(V)  Alkaline Phos 38 - 126 U/L 69 60 87  AST 15 - 41 U/L 24 22 29   ALT 0 - 44 U/L 17 16 22      Imaging studies: No new pertinent imaging studies   Assessment/Plan: (ICD-10's: 37.1) 54 y.o. hemodynamically stable male still with LUQ abdominal pain, without signs of active hemorrhage, attributable to apparently atraumatic splenic laceration and hemoperitoneum.    - Will initiate CLD today  - continue IVF resuscitation  - monitor H&H; stable  - serial abdominal examinations  - Will plan on repeat CT Abdomen/Pelvis with contrast tomorrow morning to reassess intra-abdominal processes    - medical management of comorbidities  - hold chemical DVT prophylaxis; SCDs okay   All of the above findings and recommendations were discussed with the patient, and the medical team, and all of patient's questions were answered to his expressed satisfaction.  --  Edison Simon, PA-C [facility] 12/24/2019, 7:59 AM [phone] M-F: 7am - 4pm

## 2019-12-25 ENCOUNTER — Telehealth: Payer: Self-pay | Admitting: Pharmacy Technician

## 2019-12-25 ENCOUNTER — Inpatient Hospital Stay: Payer: Medicaid Other

## 2019-12-25 ENCOUNTER — Other Ambulatory Visit: Payer: Self-pay | Admitting: Surgery

## 2019-12-25 ENCOUNTER — Encounter: Payer: Self-pay | Admitting: Surgery

## 2019-12-25 LAB — BASIC METABOLIC PANEL
Anion gap: 6 (ref 5–15)
BUN: 8 mg/dL (ref 6–20)
CO2: 29 mmol/L (ref 22–32)
Calcium: 8.6 mg/dL — ABNORMAL LOW (ref 8.9–10.3)
Chloride: 106 mmol/L (ref 98–111)
Creatinine, Ser: 0.9 mg/dL (ref 0.61–1.24)
GFR calc Af Amer: >60 mL/min (ref >60)
GFR calc non Af Amer: >60 mL/min (ref >60)
Glucose, Bld: 87 mg/dL (ref 70–99)
Potassium: 3.9 mmol/L (ref 3.5–5.1)
Sodium: 141 mmol/L (ref 135–145)

## 2019-12-25 LAB — CBC
HCT: 35.5 % — ABNORMAL LOW (ref 39.0–52.0)
Hemoglobin: 12.1 g/dL — ABNORMAL LOW (ref 13.0–17.0)
MCH: 32.8 pg (ref 26.0–34.0)
MCHC: 34.1 g/dL (ref 30.0–36.0)
MCV: 96.2 fL (ref 80.0–100.0)
Platelets: 382 10*3/uL (ref 150–400)
RBC: 3.69 MIL/uL — ABNORMAL LOW (ref 4.22–5.81)
RDW: 12.2 % (ref 11.5–15.5)
WBC: 7.8 10*3/uL (ref 4.0–10.5)
nRBC: 0 % (ref 0.0–0.2)

## 2019-12-25 MED ORDER — HYDROCODONE-ACETAMINOPHEN 5-325 MG PO TABS: 1.0000 | ORAL_TABLET | Freq: Four times a day (QID) | ORAL | 0 refills | Status: DC | PRN

## 2019-12-25 MED ORDER — IOHEXOL 300 MG/ML  SOLN
100.0000 mL | Freq: Once | INTRAMUSCULAR | Status: AC | PRN
  Administered 2019-12-25: 07:00:00 100 mL via INTRAVENOUS

## 2019-12-25 NOTE — Discharge Summary (Signed)
[facility] SURGICAL DISCHARGE SUMMARY (cpt: [phone])  Patient ID: Bill Villanueva MRN: [id number] DOB/AGE: 1966/04/24 54 y.o.  Admit date: 12/23/2019 Discharge date: 12/25/2019  Discharge Diagnoses Patient Active Problem List   Diagnosis Date Noted  . Splenic laceration 12/23/2019  . Esophageal obstruction due to food impaction   . Tobacco use disorder 11/16/2017  . Cocaine use disorder, moderate, dependence (HCC) 11/15/2017  . Alcohol abuse 11/15/2017  . Cannabis use disorder, moderate, dependence (HCC) 11/15/2017  . PTSD (post-traumatic stress disorder) 11/15/2017    Consultants None  Procedures None  HPI: Bill Villanueva is a 54 y.o. male with acute onset of left upper quadrant/left lower chest pain..  Patient reports he was standing at the stove and had a sudden onset of a left-sided sharp stabbing pain of his lower left chest where he could not breathe.  He was quite agitated on arrival.  He denies any history of trauma.  Movement makes the pain worse.  Worse with deep inspiration.  Endorses prior history of substance abuse, but denies any recent consumption.  CT scan identifies a splenic lack with hemoperitoneum, but no evidence of active hemorrhage.  Prior CT reviewed from a decade ago showed no splenic abnormality.  Urine drug screen is pending.  TXA will be given in the ED. He reports nothing has helped his pain.   Hospital Course: He was observed and his HGB remained stable around 12. No signs of re-bleeding. He had repeat imaging on 03/31 which were reassuring. Advancement of patient's diet and ambulation were well-tolerated. The remainder of patient's hospital course was essentially unremarkable, and discharge planning was initiated accordingly with patient safely able to be discharged home with appropriate discharge instructions, pain control, and outpatient follow-up after all of his questions were answered to his expressed satisfaction.   Discharge  Condition: Good   Physical Examination:  Constitutional: alert, cooperative and no distress, thin appearing male HENT: normocephalic without obvious abnormality  Eyes: PERRL, EOM's grossly intact and symmetric  Respiratory: breathing non-labored at rest  Cardiovascular: regular rate and sinus rhythm  Gastrointestinal: Soft, improved LUQ tenderness and some left flank pain, and non-distended, no rebound/guarding Musculoskeletal: no edema or wounds, motor and sensation grossly intact, NT     Allergies as of 12/25/2019   No Known Allergies     Medication List    TAKE these medications   Advair HFA 230-21 MCG/ACT inhaler Generic drug: fluticasone-salmeterol Inhale into the lungs.   cyclobenzaprine 10 MG tablet Commonly known as: FLEXERIL Take 1 tablet (10 mg total) by mouth 3 (three) times daily as needed.   diazepam 5 MG tablet Commonly known as: VALIUM Take by mouth.   docusate sodium 100 MG capsule Commonly known as: COLACE Take by mouth.   DULoxetine 60 MG capsule Commonly known as: CYMBALTA Take 1 capsule (60 mg total) by mouth daily with breakfast.   FLUoxetine 20 MG capsule Commonly known as: PROZAC Take 1 capsule by mouth daily.   HYDROcodone-acetaminophen 5-325 MG tablet Commonly known as: NORCO/VICODIN Take 1 tablet by mouth every 6 (six) hours as needed for severe pain.   hydrOXYzine 50 MG tablet Commonly known as: ATARAX/VISTARIL Take 1 tablet (50 mg total) by mouth 3 (three) times daily as needed for anxiety.   ipratropium-albuterol 0.5-2.5 (3) MG/3ML Soln Commonly known as: DUONEB Inhale into the lungs.   lidocaine 5 % ointment Commonly known as: XYLOCAINE Apply 5 grams to area up to twice daily   lisinopril 10 MG tablet Commonly  known as: ZESTRIL Take 1 tablet by mouth daily.   megestrol 40 MG tablet Commonly known as: MEGACE Take by mouth.   megestrol 400 MG/10ML suspension Commonly known as: MEGACE Take 10 mLs (400 mg total) by mouth  daily.   omeprazole 20 MG capsule Commonly known as: PRILOSEC Take by mouth.   omeprazole 40 MG capsule Commonly known as: PRILOSEC Take 1 capsule (40 mg total) by mouth 2 (two) times daily before a meal.   prazosin 2 MG capsule Commonly known as: MINIPRESS Take 1 capsule (2 mg total) by mouth 2 (two) times daily.   predniSONE 10 MG (21) Tbpk tablet Commonly known as: STERAPRED UNI-PAK 21 TAB Take 6 pills on day one then decrease by 1 pill each day   albuterol (2.5 MG/3ML) 0.083% nebulizer solution Commonly known as: PROVENTIL Inhale into the lungs.   Proventil HFA 108 (90 Base) MCG/ACT inhaler Generic drug: albuterol Inhale into the lungs.   QUEtiapine 50 MG tablet Commonly known as: SEROQUEL Take by mouth.   QUEtiapine 300 MG tablet Commonly known as: SEROQUEL Take 1 tablet (300 mg total) by mouth at bedtime.   traZODone 50 MG tablet Commonly known as: DESYREL Take by mouth.        Follow-up Information    Tylene Fantasia, PA-C. Schedule an appointment as soon as possible for a visit in 3 week(s).   Specialty: Physician Assistant Why: 2-3 week follow up, splenic laceration Contact information: [address] [place] [address] [phone]            Time spent on discharge management including discussion of hospital course, clinical condition, outpatient instructions, prescriptions, and follow up with the patient and members of the medical team: >30 minutes  -- Edison Simon , PA-C [facility]  12/25/2019, 10:02 AM [phone] M-F: 7am - 4pm

## 2019-12-25 NOTE — Discharge Instructions (Signed)
Splenic Injury  A splenic injury is an injury of the spleen. The spleen is an organ located in the upper left area of the abdomen, just under the ribs. The spleen filters and cleans the blood. It also stores blood cells and destroys cells that are worn out. The spleen also plays an important role in fighting disease. Splenic injuries can vary. In some cases, the spleen may only be bruised, with some bleeding inside the covering and around the spleen. Splenic injuries may also cause a deep tear or cut into the spleen (lacerated spleen). Some splenic injuries can cause the spleen to break open (rupture). What are the causes? This condition may be caused by a direct blow (trauma) to the spleen. Trauma can result from:  Car accidents.  Contact sports.  Falls.  Penetrating injuries. These can be caused by gunshot wounds or sharp objects such as a knife. What increases the risk? You may be at greater risk for a splenic injury if you have a disease that can cause the spleen to become enlarged. These include:  Alcoholic liver disease.  Viral infections, especially mononucleosis.  Certain inflammatory diseases, such as lupus.  Certain cancers, especially those that involve the lymphatic system.  Cystic fibrosis. What are the signs or symptoms? Symptoms of this condition depend on the severity of the injury. A minor injury often causes no symptoms or only minor pain in the abdomen. A major injury can result in severe bleeding, causing your blood pressure to decrease rapidly. This in turn will cause symptoms such as:  Dizziness or light-headedness.  Rapid heart rate.  Difficulty breathing.  Fainting.  Sweating with clammy skin. Other symptoms of a splenic injury may include:  Very bad abdominal pain.  Pain in the left shoulder.  Pain when the abdomen is pressed (tenderness).  Nausea.  Swelling or bruising of the abdomen. How is this diagnosed? This condition may be diagnosed  based on:  Your symptoms and medical history, especially if you were recently in an accident or you recently got hurt.  A physical exam.  Imaging tests, such as: ? CT scan. ? Ultrasound. You may have frequent blood tests for a few days after the injury to monitor your condition. How is this treated? Treatment depends on the type of splenic injury you have and how bad it is.  Less severe injuries may be treated with: ? Observation. ? Interventional radiology. This involves using flexible tubes (catheters) to stop the bleeding from inside the blood vessel.  More severe injuries may require hospitalization in the intensive care unit (ICU). While you are in the hospital: ? Your fluid and blood levels will be monitored closely. ? You will get fluids through an IV as needed. ? You may need follow-up scans to check whether your spleen is able to heal itself. If the injury is getting worse, you may need surgery. ? You may receive donated blood (transfusion). ? You may have a long needle inserted into your abdomen to remove any blood that has collected inside the spleen (hematoma).  If your blood pressure is too low, you may need emergency surgery. This may include: ? Repairing a laceration. ? Removing part of the spleen. ? Removing the entire spleen (splenectomy). Follow these instructions at home: Activity  Rest as told by your health care provider.  Avoid sitting for a long time without moving. Get up to take short walks every 1-2 hours. This is important to improve blood flow and breathing. Ask for help   if you feel weak or unsteady.  Do not participate in any activity that takes a lot of effort until your health care provider says that it is safe.  Do not take part in contact sports until your health care provider says it is safe to do so.  Do not lift anything that is heavier than 10 lb (4.5 kg), or the limit that you are told, until your health care provider says that it is  safe. General instructions  Take over-the-counter and prescription medicines only as told by your health care provider.  Stay up-to-date on vaccines as told by your health care provider.  Follow instructions from your health care provider about eating or drinking restrictions.  Do not drink alcohol.  Do not use any products that contain nicotine or tobacco, such as cigarettes and e-cigarettes. These may delay healing after an injury. If you need help quitting, ask your health care provider.  Keep all follow-up visits as told by your health care provider. This is important. Get help right away if you have:  A fever.  New or increasing pain in your abdomen or in your left shoulder.  Signs or symptoms of internal bleeding. Watch for: ? Sweating. ? Dizziness. ? Weakness. ? Cold and clammy skin. ? Fainting.  Chest pain or difficulty breathing. Summary  The spleen is an organ located in the upper left area of the abdomen, just under the ribs.  A splenic injury is an injury of the spleen. This may be caused by a direct blow (trauma) to the spleen.  You may be at greater risk for a splenic injury if you have a disease that can cause the spleen to become enlarged.  A minor injury often causes no symptoms or only minor pain in the abdomen. A major injury can result in severe bleeding, causing your blood pressure to decrease rapidly.  Treatment depends on the type of splenic injury you have and how bad it is. This information is not intended to replace advice given to you by your health care provider. Make sure you discuss any questions you have with your health care provider. Document Revised: 11/24/2017 Document Reviewed: 09/06/2017 Elsevier Patient Education  2020 [employer]  

## 2019-12-25 NOTE — TOC Transition Note (Signed)
Transition of Care [facility]) - CM/SW Discharge Note   Patient Details  Name: Bill Villanueva MRN: [id number] Date of Birth: 30-Jul-1966  Transition of Care [facility]) CM/SW Contact:  Chapman Fitch, RN Phone Number: 12/25/2019, 10:19 AM   Clinical Narrative:     Patient to discharge today.  RNCM contacted [facility] and confirmed his medication would be processed through his insurance.   Patient has an outstanding balance of $18.  I have notified the patient and he confirms he will be able to pay and pick up his medication.   [facility] and additional resources provided in discharge packet     Barriers to Discharge: Continued Medical Work up   Patient Goals and CMS Choice        Discharge Placement                       Discharge Plan and Services   Discharge Planning Services: CM Consult                                 Social Determinants of Health (SDOH) Interventions     Readmission Risk Interventions No flowsheet data found.

## 2019-12-25 NOTE — Telephone Encounter (Signed)
Patient has Medicaid.  No longer meets [facility]'s eligibility criteria.  Patient notified by letter.  Ceara Wrightson J. Maysa Lynn Care Manager [facility] 

## 2019-12-26 ENCOUNTER — Other Ambulatory Visit: Payer: Self-pay | Admitting: Gastroenterology

## 2019-12-26 ENCOUNTER — Other Ambulatory Visit: Payer: Self-pay | Admitting: Physician Assistant

## 2019-12-26 MED ORDER — HYDROCODONE-ACETAMINOPHEN 5-325 MG PO TABS: 1.0000 | ORAL_TABLET | Freq: Four times a day (QID) | ORAL | 0 refills | Status: DC | PRN

## 2019-12-26 NOTE — Progress Notes (Signed)
Patient called stating Rx not sent to pharmacy. I reviewed the Rx and it notes "transmission to pharmacy failed." I placed new order for this and will follow up to ensure no issues. He understands to call our office if there are any issues.

## 2020-01-07 ENCOUNTER — Ambulatory Visit: Payer: Medicaid Other | Admitting: Physician Assistant

## 2020-11-24 IMAGING — CT CT HEAD W/O CM
3 series · 14 of 47 positions shown, 16 images · non-contrast
Comparison: August 28, 2012

CLINICAL DATA: Post head trauma, drinking and fall

EXAM:
CT HEAD WITHOUT CONTRAST
TECHNIQUE: Contiguous axial images were obtained from the base of the skull
through the vertex without intravenous contrast.

[Series 2: head wo · axial · 0.42mm/px · z∈[-64,+61]mm · 8 of 30 slices shown, 10 images]
[im 3/30  brain]
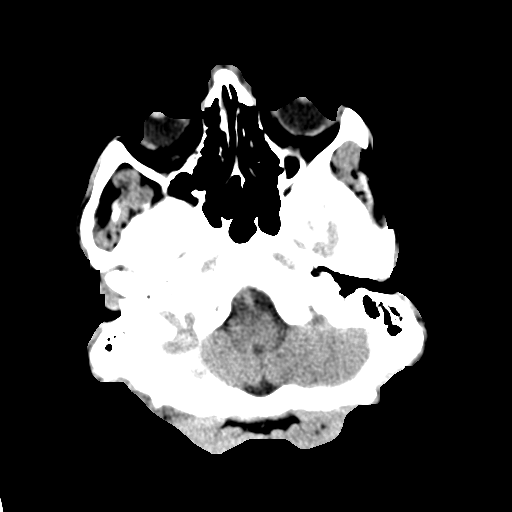
[im 3/30  bone]
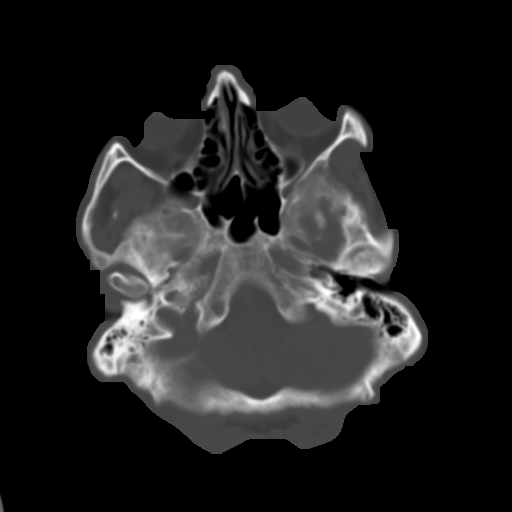
[im 7/30  brain]
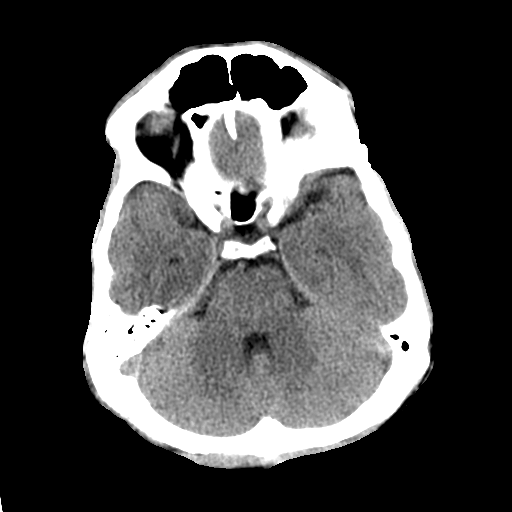
[im 10/30  brain]
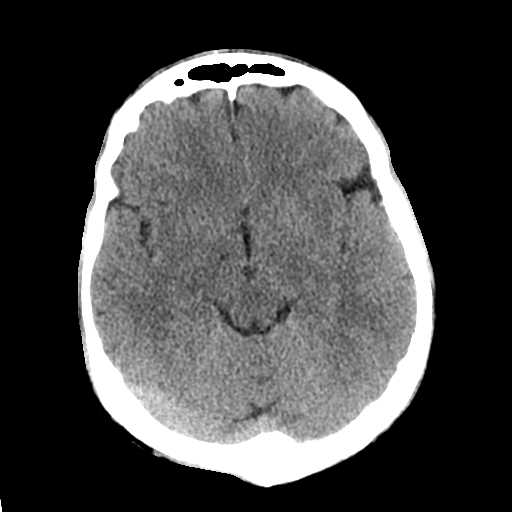
[im 14/30  brain]
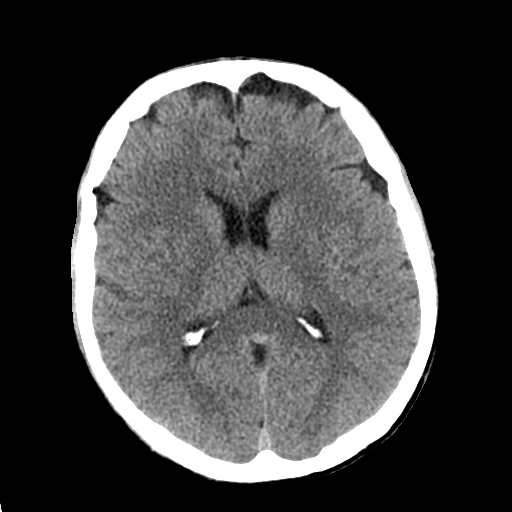
[im 17/30  brain]
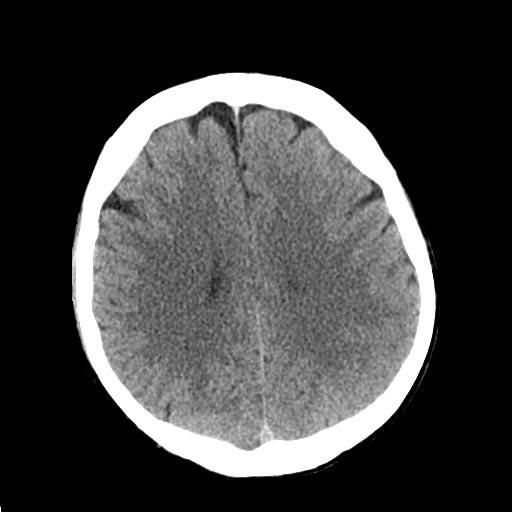
[im 17/30  bone]
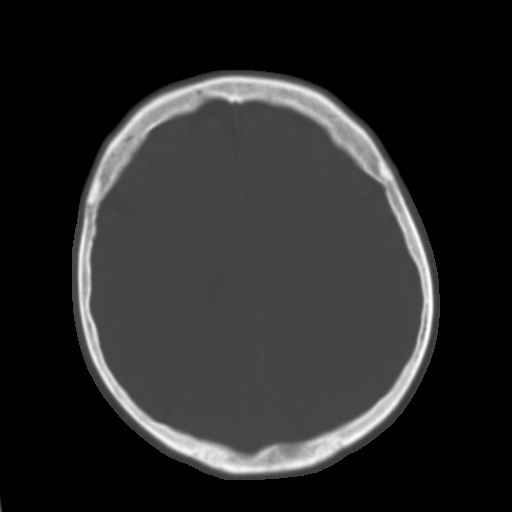
[im 21/30  brain]
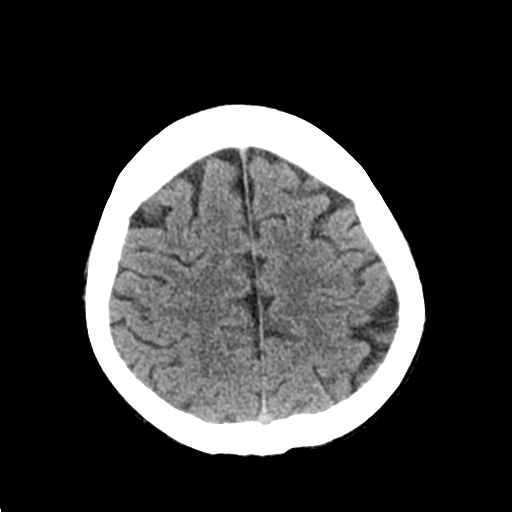
[im 24/30  brain]
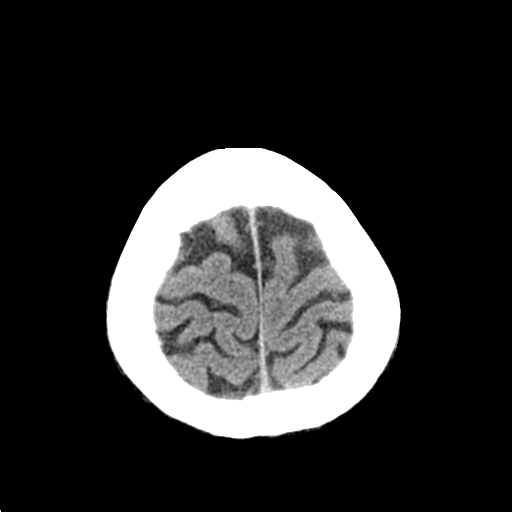
[im 28/30  brain]
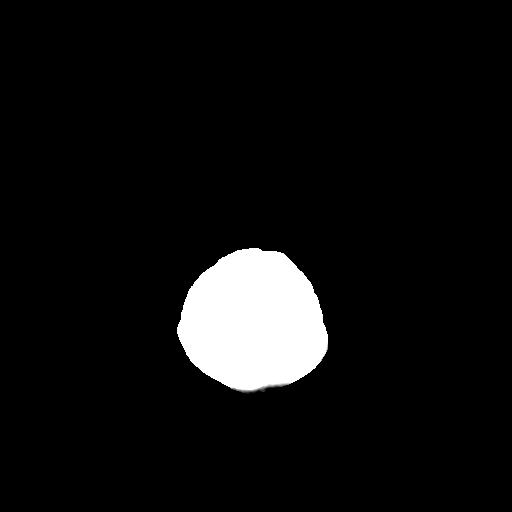

[Series 4: coronal soft tissue · coronal · 0.30mm/px · 3 of 61 slices shown]
[im 21/61  brain]
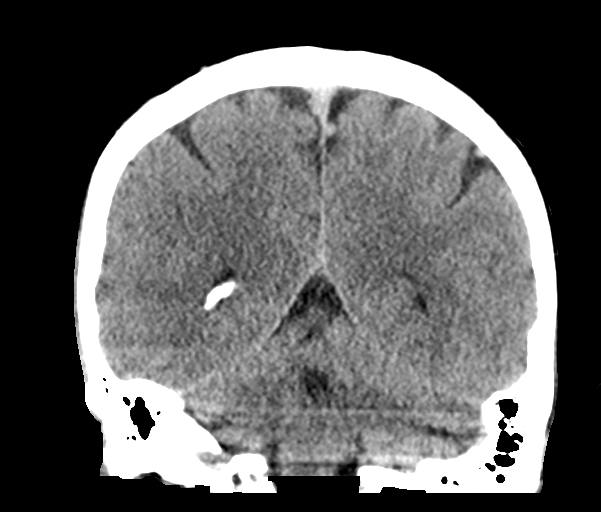
[im 27/61  brain]
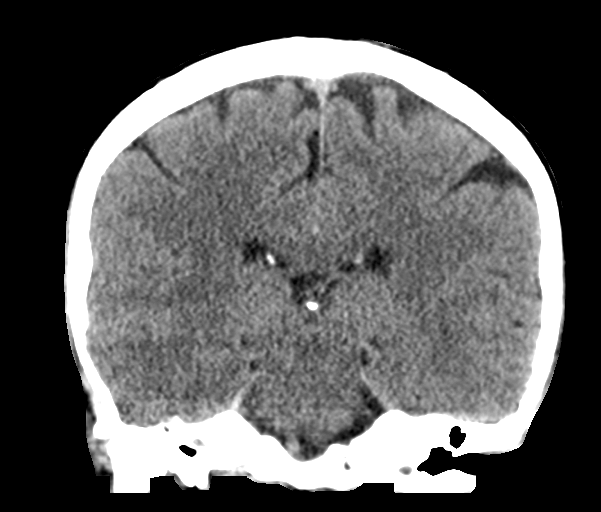
[im 34/61  brain]
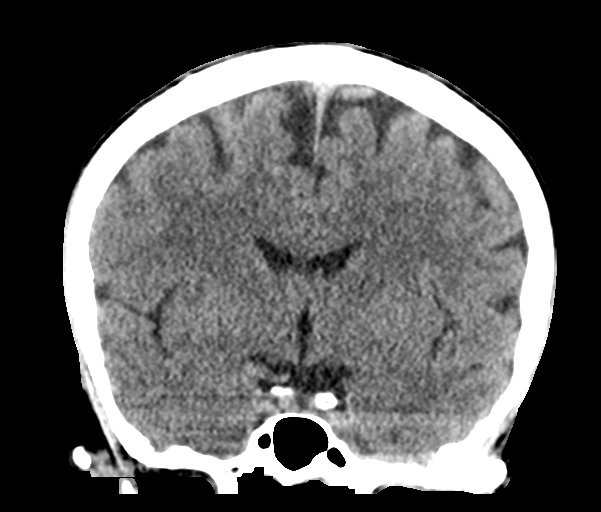

[Series 5: sagittal soft tissue · sagittal · 0.31mm/px · 3 of 54 slices shown]
[im 18/54  brain]
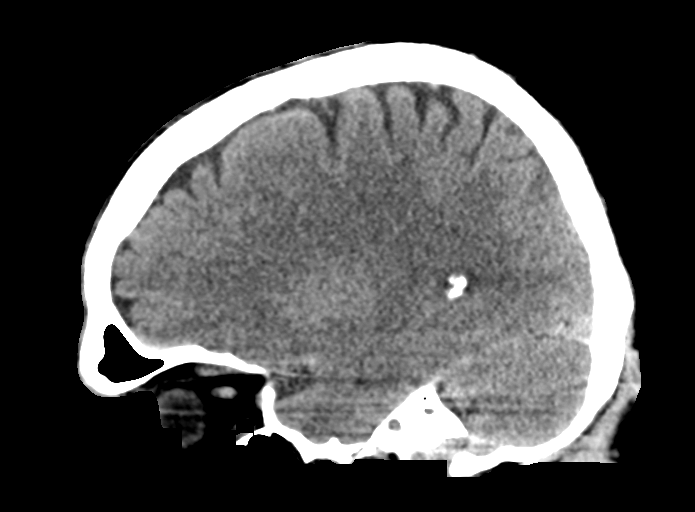
[im 27/54  brain]
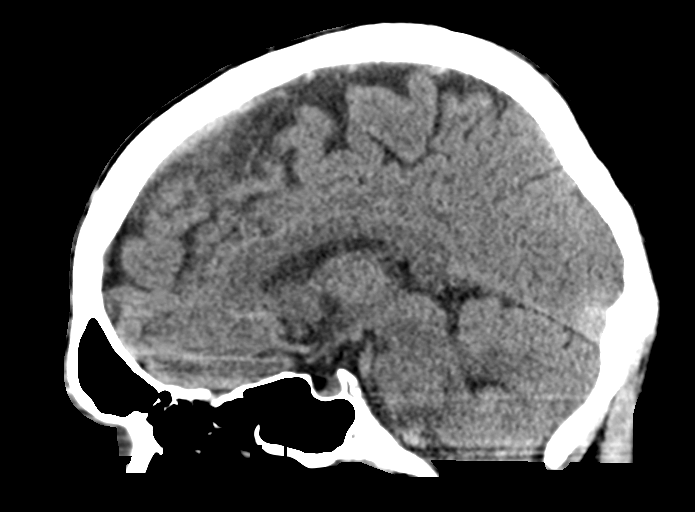
[im 36/54  brain]
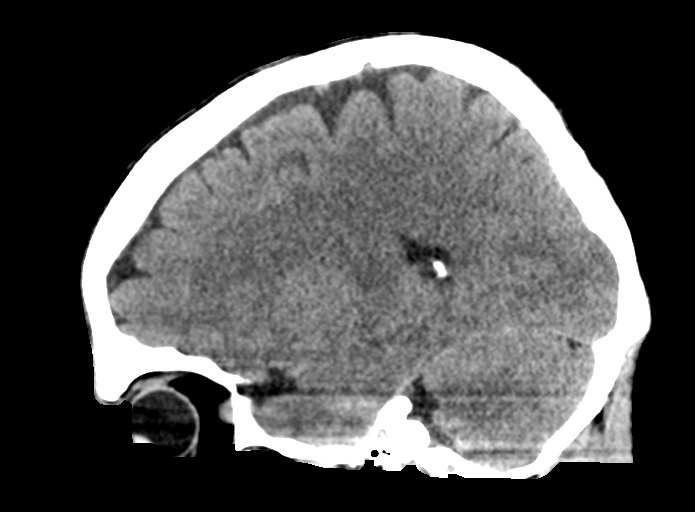

[14 of 47 positions shown; findings below may reference images not displayed]

FINDINGS: Brain: No evidence of acute territorial infarction, hemorrhage,
hydrocephalus,extra-axial collection or mass lesion/mass effect.
Normal gray-white differentiation. Ventricles are normal in size and
contour.

Vascular: No hyperdense vessel or unexpected calcification.

Skull: The skull is intact. There are bilateral comminuted nasal
bone fractures as on the prior exam. There does appear to be new
fracture seen at the anterior process of the nasal bone however.
This is best seen on CT face below. Deviated septum is seen.

Sinuses/Orbits: The visualized paranasal sinuses and mastoid air
cells are clear. The orbits and globes intact.

Other: None

Face:

Osseous: Again noted are bilateral comminuted nasal bone fractures
as on the prior CT of 8495. There does however appear to be new
anterior process nasal bone fracture. Significant overlying soft
tissue swelling is noted. There is also a soft tissue hematoma seen
overlying the inferior tip of the nose measuring approximately 3 cm
in transverse dimension. There is a deviated nasal septum. No other
acute fracture is seen.

Orbits: No fracture identified. Unremarkable appearance of globes
and orbits.

Sinuses: Small left maxillary retention cyst is seen. The remainder
of the visualized paranasal sinuses and mastoid air cells are
unremarkable.

Soft tissues:  No acute findings.

Limited intracranial: No acute findings.

Cervical spine:

Alignment: Physiologic

Skull base and vertebrae: Visualized skull base is intact. No
atlanto-occipital dissociation. The vertebral body heights are well
maintained. No fracture or pathologic osseous lesion seen.

Soft tissues and spinal canal: The visualized paraspinal soft
tissues are unremarkable. No prevertebral soft tissue swelling is
seen. The spinal canal is grossly unremarkable, no large epidural
collection or significant canal narrowing.

Disc levels: Mild disc height loss with disc osteophyte complexes
seen at C5-C6.

Upper chest: Bilateral apical centrilobular bullous changes are
seen.

Other: None
IMPRESSION: 1. New nasal bone fractures involving the anterior process with
overlying 3 cm soft tissue hematoma along the inferior nose.
2. Prior bilateral comminuted nasal bone fractures as on CT from
[DATE]. No acute intracranial abnormality.
4.  No acute fracture or malalignment of the spine.
5. Extensive centrilobular emphysematous/bullous changes.

## 2021-07-02 ENCOUNTER — Encounter: Payer: Self-pay | Admitting: General Surgery

## 2021-07-07 ENCOUNTER — Other Ambulatory Visit: Payer: Self-pay

## 2021-07-07 ENCOUNTER — Emergency Department: Payer: Medicaid Other

## 2021-07-07 ENCOUNTER — Inpatient Hospital Stay
Admission: EM | Admit: 2021-07-07 | Discharge: 2021-07-11 | DRG: 871 | Disposition: A | Discharge status: Home / Self Care | Payer: Medicaid Other | Attending: Internal Medicine | Admitting: Internal Medicine

## 2021-07-07 DIAGNOSIS — G9340 Encephalopathy, unspecified: Secondary | ICD-10-CM | POA: Diagnosis not present

## 2021-07-07 DIAGNOSIS — S022XXA Fracture of nasal bones, initial encounter for closed fracture: Secondary | ICD-10-CM

## 2021-07-07 DIAGNOSIS — E8729 Other acidosis: Secondary | ICD-10-CM | POA: Diagnosis present

## 2021-07-07 DIAGNOSIS — J9602 Acute respiratory failure with hypercapnia: Secondary | ICD-10-CM | POA: Diagnosis present

## 2021-07-07 DIAGNOSIS — A419 Sepsis, unspecified organism: Principal | ICD-10-CM | POA: Diagnosis present

## 2021-07-07 DIAGNOSIS — K920 Hematemesis: Secondary | ICD-10-CM | POA: Diagnosis present

## 2021-07-07 DIAGNOSIS — F319 Bipolar disorder, unspecified: Secondary | ICD-10-CM | POA: Diagnosis present

## 2021-07-07 DIAGNOSIS — I10 Essential (primary) hypertension: Secondary | ICD-10-CM | POA: Diagnosis present

## 2021-07-07 DIAGNOSIS — F1510 Other stimulant abuse, uncomplicated: Secondary | ICD-10-CM | POA: Diagnosis present

## 2021-07-07 DIAGNOSIS — F1010 Alcohol abuse, uncomplicated: Secondary | ICD-10-CM | POA: Diagnosis present

## 2021-07-07 DIAGNOSIS — R000 Tachycardia, unspecified: Secondary | ICD-10-CM | POA: Diagnosis present

## 2021-07-07 DIAGNOSIS — F419 Anxiety disorder, unspecified: Secondary | ICD-10-CM | POA: Diagnosis present

## 2021-07-07 DIAGNOSIS — G9341 Metabolic encephalopathy: Secondary | ICD-10-CM

## 2021-07-07 DIAGNOSIS — G459 Transient cerebral ischemic attack, unspecified: Secondary | ICD-10-CM | POA: Diagnosis present

## 2021-07-07 DIAGNOSIS — F1210 Cannabis abuse, uncomplicated: Secondary | ICD-10-CM | POA: Diagnosis present

## 2021-07-07 DIAGNOSIS — F4310 Post-traumatic stress disorder, unspecified: Secondary | ICD-10-CM | POA: Diagnosis present

## 2021-07-07 DIAGNOSIS — J690 Pneumonitis due to inhalation of food and vomit: Secondary | ICD-10-CM

## 2021-07-07 DIAGNOSIS — I429 Cardiomyopathy, unspecified: Secondary | ICD-10-CM | POA: Diagnosis present

## 2021-07-07 DIAGNOSIS — Y900 Blood alcohol level of less than 20 mg/100 ml: Secondary | ICD-10-CM | POA: Diagnosis present

## 2021-07-07 DIAGNOSIS — R531 Weakness: Secondary | ICD-10-CM

## 2021-07-07 DIAGNOSIS — F17210 Nicotine dependence, cigarettes, uncomplicated: Secondary | ICD-10-CM | POA: Diagnosis present

## 2021-07-07 DIAGNOSIS — F1910 Other psychoactive substance abuse, uncomplicated: Secondary | ICD-10-CM | POA: Diagnosis not present

## 2021-07-07 DIAGNOSIS — G928 Other toxic encephalopathy: Secondary | ICD-10-CM | POA: Diagnosis present

## 2021-07-07 DIAGNOSIS — R6520 Severe sepsis without septic shock: Secondary | ICD-10-CM

## 2021-07-07 DIAGNOSIS — F1410 Cocaine abuse, uncomplicated: Secondary | ICD-10-CM | POA: Diagnosis present

## 2021-07-07 DIAGNOSIS — Z20822 Contact with and (suspected) exposure to covid-19: Secondary | ICD-10-CM | POA: Diagnosis present

## 2021-07-07 DIAGNOSIS — X58XXXA Exposure to other specified factors, initial encounter: Secondary | ICD-10-CM | POA: Diagnosis present

## 2021-07-07 DIAGNOSIS — J189 Pneumonia, unspecified organism: Secondary | ICD-10-CM

## 2021-07-07 DIAGNOSIS — F200 Paranoid schizophrenia: Secondary | ICD-10-CM | POA: Diagnosis present

## 2021-07-07 LAB — CBC WITH DIFFERENTIAL/PLATELET
Abs Immature Granulocytes: 0.19 10*3/uL — ABNORMAL HIGH (ref 0.00–0.07)
Basophils Absolute: 0.1 10*3/uL (ref 0.0–0.1)
Basophils Relative: 0 %
Eosinophils Absolute: 0.1 10*3/uL (ref 0.0–0.5)
Eosinophils Relative: 0 %
HCT: 42.8 % (ref 39.0–52.0)
Hemoglobin: 13.7 g/dL (ref 13.0–17.0)
Immature Granulocytes: 1 %
Lymphocytes Relative: 4 %
Lymphs Abs: 1.3 10*3/uL (ref 0.7–4.0)
MCH: 31.7 pg (ref 26.0–34.0)
MCHC: 32 g/dL (ref 30.0–36.0)
MCV: 99.1 fL (ref 80.0–100.0)
Monocytes Absolute: 2.7 10*3/uL — ABNORMAL HIGH (ref 0.1–1.0)
Monocytes Relative: 9 %
Neutro Abs: 26 10*3/uL — ABNORMAL HIGH (ref 1.7–7.7)
Neutrophils Relative %: 86 %
Platelets: 485 10*3/uL — ABNORMAL HIGH (ref 150–400)
RBC: 4.32 MIL/uL (ref 4.22–5.81)
RDW: 12.4 % (ref 11.5–15.5)
Smear Review: NORMAL
WBC: 30.3 10*3/uL — ABNORMAL HIGH (ref 4.0–10.5)
nRBC: 0 % (ref 0.0–0.2)

## 2021-07-07 LAB — RESPIRATORY PANEL BY PCR

## 2021-07-07 LAB — RESP PANEL BY RT-PCR (FLU A&B, COVID) ARPGX2
Influenza A by PCR: NEGATIVE
Influenza B by PCR: NEGATIVE
SARS Coronavirus 2 by RT PCR: NEGATIVE

## 2021-07-07 LAB — COMPREHENSIVE METABOLIC PANEL
ALT: 27 U/L (ref 0–44)
AST: 35 U/L (ref 15–41)
Albumin: 5 g/dL (ref 3.5–5.0)
Alkaline Phosphatase: 104 U/L (ref 38–126)
Anion gap: 12 (ref 5–15)
BUN: 26 mg/dL — ABNORMAL HIGH (ref 6–20)
CO2: 31 mmol/L (ref 22–32)
Calcium: 9.8 mg/dL (ref 8.9–10.3)
Chloride: 97 mmol/L — ABNORMAL LOW (ref 98–111)
Creatinine, Ser: 1.13 mg/dL (ref 0.61–1.24)
GFR, Estimated: >60 mL/min (ref >60)
Glucose, Bld: 97 mg/dL (ref 70–99)
Potassium: 5.2 mmol/L — ABNORMAL HIGH (ref 3.5–5.1)
Sodium: 140 mmol/L (ref 135–145)
Total Bilirubin: 0.4 mg/dL (ref 0.3–1.2)
Total Protein: 8.4 g/dL — ABNORMAL HIGH (ref 6.5–8.1)

## 2021-07-07 LAB — TYPE AND SCREEN
ABO/RH(D): O POS
Antibody Screen: NEGATIVE

## 2021-07-07 LAB — TROPONIN I (HIGH SENSITIVITY): Troponin I (High Sensitivity): 16 ng/L (ref <18)

## 2021-07-07 LAB — MAGNESIUM: Magnesium: 2.4 mg/dL (ref 1.7–2.4)

## 2021-07-07 LAB — LACTIC ACID, PLASMA
Lactic Acid, Venous: 7.1 mmol/L (ref 0.5–1.9)
Lactic Acid, Venous: 1.7 mmol/L (ref 0.5–1.9)

## 2021-07-07 LAB — ETHANOL: Alcohol, Ethyl (B): <10 mg/dL (ref <10)

## 2021-07-07 LAB — PROTIME-INR
INR: 0.9 (ref 0.8–1.2)
Prothrombin Time: 12.3 seconds (ref 11.4–15.2)

## 2021-07-07 LAB — PROCALCITONIN: Procalcitonin: 0.38 ng/mL

## 2021-07-07 LAB — CK: Total CK: 340 U/L (ref 49–397)

## 2021-07-07 LAB — MRSA NEXT GEN BY PCR, NASAL: MRSA by PCR Next Gen: DETECTED — AB

## 2021-07-07 LAB — APTT: aPTT: 34 seconds (ref 24–36)

## 2021-07-07 LAB — TSH: TSH: 0.572 u[IU]/mL (ref 0.350–4.500)

## 2021-07-07 MED ORDER — LACTATED RINGERS IV BOLUS
1000.0000 mL | Freq: Once | INTRAVENOUS | Status: AC
  Administered 2021-07-07: 1000 mL via INTRAVENOUS

## 2021-07-07 MED ORDER — ADULT MULTIVITAMIN W/MINERALS CH
1.0000 | ORAL_TABLET | Freq: Every day | ORAL | Status: DC
  Administered 2021-07-08 – 2021-07-11 (×4): 1 via ORAL
  Filled 2021-07-07 (×4): qty 1

## 2021-07-07 MED ORDER — SODIUM CHLORIDE 0.9 % IV SOLN: 250.0000 mL | INTRAVENOUS | Status: DC | PRN

## 2021-07-07 MED ORDER — CEFTRIAXONE SODIUM 2 G IJ SOLR
2.0000 g | INTRAMUSCULAR | Status: DC
Start: 2021-07-07 — End: 2021-07-10
  Administered 2021-07-07 – 2021-07-09 (×3): 2 g via INTRAVENOUS
  Filled 2021-07-07 (×4): qty 20
  Filled 2021-07-07: qty 2

## 2021-07-07 MED ORDER — SODIUM CHLORIDE 0.9 % IV BOLUS: 500.0000 mL | Freq: Once | INTRAVENOUS | Status: DC

## 2021-07-07 MED ORDER — ONDANSETRON HCL 4 MG/2ML IJ SOLN
4.0000 mg | INTRAMUSCULAR | Status: AC
  Administered 2021-07-07: 4 mg via INTRAVENOUS
  Filled 2021-07-07: qty 2

## 2021-07-07 MED ORDER — DOCUSATE SODIUM 100 MG PO CAPS: 100.0000 mg | ORAL_CAPSULE | Freq: Two times a day (BID) | ORAL | Status: DC | PRN

## 2021-07-07 MED ORDER — ONDANSETRON HCL 4 MG/2ML IJ SOLN: 4.0000 mg | Freq: Four times a day (QID) | INTRAMUSCULAR | Status: DC | PRN

## 2021-07-07 MED ORDER — NALOXONE HCL 4 MG/10ML IJ SOLN
2.0000 mg/h | INTRAVENOUS | Status: DC
  Administered 2021-07-07 (×2): 2 mg/h via INTRAVENOUS
  Filled 2021-07-07 (×7): qty 10

## 2021-07-07 MED ORDER — POLYETHYLENE GLYCOL 3350 17 G PO PACK: 17.0000 g | PACK | Freq: Every day | ORAL | Status: DC | PRN

## 2021-07-07 MED ORDER — CHLORHEXIDINE GLUCONATE CLOTH 2 % EX PADS
6.0000 | MEDICATED_PAD | Freq: Every day | CUTANEOUS | Status: DC
  Administered 2021-07-07 – 2021-07-08 (×2): 6 via TOPICAL
  Filled 2021-07-07: qty 6

## 2021-07-07 MED ORDER — SODIUM CHLORIDE 0.9 % IV SOLN
500.0000 mg | INTRAVENOUS | Status: DC
  Administered 2021-07-07: 500 mg via INTRAVENOUS
  Filled 2021-07-07 (×2): qty 500

## 2021-07-07 MED ORDER — LACTATED RINGERS IV SOLN: INTRAVENOUS | Status: DC

## 2021-07-07 MED ORDER — NALOXONE HCL 2 MG/2ML IJ SOSY
2.0000 mg | PREFILLED_SYRINGE | Freq: Once | INTRAMUSCULAR | Status: AC
  Administered 2021-07-07: 2 mg via INTRAVENOUS
  Filled 2021-07-07: qty 2

## 2021-07-07 MED ORDER — NALOXONE HCL 2 MG/2ML IJ SOSY
2.0000 mg | PREFILLED_SYRINGE | INTRAMUSCULAR | Status: AC | PRN
  Administered 2021-07-07: 2 mg via INTRAVENOUS

## 2021-07-07 MED ORDER — LACTATED RINGERS IV BOLUS (SEPSIS)
500.0000 mL | Freq: Once | INTRAVENOUS | Status: AC
  Administered 2021-07-07: 500 mL via INTRAVENOUS

## 2021-07-07 MED ORDER — ACETAMINOPHEN 325 MG PO TABS
650.0000 mg | ORAL_TABLET | ORAL | Status: DC | PRN
  Administered 2021-07-09 – 2021-07-10 (×2): 650 mg via ORAL
  Filled 2021-07-07 (×2): qty 2

## 2021-07-07 MED ORDER — SODIUM CHLORIDE 0.9% FLUSH
3.0000 mL | INTRAVENOUS | Status: DC | PRN
  Administered 2021-07-08: 3 mL via INTRAVENOUS

## 2021-07-07 MED ORDER — PANTOPRAZOLE SODIUM 40 MG IV SOLR
40.0000 mg | Freq: Two times a day (BID) | INTRAVENOUS | Status: DC
  Administered 2021-07-07 – 2021-07-10 (×6): 40 mg via INTRAVENOUS
  Filled 2021-07-07 (×6): qty 40

## 2021-07-07 MED ORDER — SODIUM CHLORIDE 0.9% FLUSH
3.0000 mL | Freq: Two times a day (BID) | INTRAVENOUS | Status: DC
  Administered 2021-07-07 – 2021-07-11 (×8): 3 mL via INTRAVENOUS

## 2021-07-07 MED ORDER — THIAMINE HCL 100 MG/ML IJ SOLN
100.0000 mg | INTRAMUSCULAR | Status: DC
  Administered 2021-07-07 – 2021-07-10 (×4): 100 mg via INTRAVENOUS
  Filled 2021-07-07 (×4): qty 2

## 2021-07-07 MED ORDER — FOLIC ACID 1 MG PO TABS
1.0000 mg | ORAL_TABLET | Freq: Every day | ORAL | Status: DC
  Administered 2021-07-08 – 2021-07-11 (×4): 1 mg via ORAL
  Filled 2021-07-07 (×4): qty 1

## 2021-07-07 NOTE — Consult Note (Signed)
CODE SEPSIS - PHARMACY COMMUNICATION  **Broad Spectrum Antibiotics should be administered within 1 hour of Sepsis diagnosis**  Time Code Sepsis Called/Page Received: 1651  Antibiotics Ordered: azithromycin and ceftriaxone   Time of 1st antibiotic administration: 1709   Doloris Hall, PharmD Pharmacy Resident  07/07/2021 4:53 PM

## 2021-07-07 NOTE — Progress Notes (Signed)
eLink Physician-Brief Progress Note Patient Name: Bill Villanueva DOB: 1965/10/05 MRN: [id number]   Date of Service  07/07/2021  HPI/Events of Note  6 M history of ETOH and polysubstance abuse, PTSD, paranoid schizophrenia presented with altered sensorium. Given Narcan with positive response and has require Narcan drip thereafter. Initial VBG with hypercapnea which has since improved. He did have episode of bloody emesis in the ED.   Head CT negative Lactic 1.7 from 7.1 WBC 30 CXR Heterogeneous opacities in the right greater than left lung base  BP 142/92  HR 79  O2 99% Seen with eyes closed, head of bed elevated  eICU Interventions  Continued on Narcan drip. Tox screen pending. ETOH negative GI bleed, H/H monitoring. GI consulted Monitor for signs of withdrawal Seizure and aspiration precaution Monitor for airway compromise. Avoid sedating medications     Intervention Category Evaluation Type: New Patient Evaluation  Darl Pikes 07/07/2021, 8:59 PM

## 2021-07-07 NOTE — Sepsis Progress Note (Signed)
eLink monitoring code sepsis.  

## 2021-07-07 NOTE — H&P (Signed)
NAME:  Bill Villanueva, MRN:  [id number], DOB:  17-Oct-1965, LOS: 0 ADMISSION DATE:  07/07/2021, CONSULTATION DATE:  07/07/2021 REFERRING MD:  Dr. Fanny Bien, CHIEF COMPLAINT: Altered mental status  Brief Pt Description / Synopsis:  55 year old male admitted with acute metabolic encephalopathy (suspect due to substance abuse) requiring Narcan drip along with acute hypoxic & hypercapnic respiratory failure and sepsis due to pneumonia (aspiration versus CAP), along with Acute Upper GI Bleed.  History of Present Illness:  Bill Villanueva's BiV is a 55 year old male with a past medical history as listed below who presented to [facility] ED on 07/07/2021 due to altered mental status.  Patient remains altered and no family is available, therefore history is obtained from ED nursing notes.  Per notes the patient's friends called EMS as he was not acting normally, was noted to be lethargic, dizzy, and weak.  He received Narcan with EMS in route to the hospital.  Upon arrival to the ED he exhibited decreased respirations, pinpoint pupils, and was also noted to be hypoxic with O2 saturations 89% on room air.  Subsequently he was given Narcan 2 mg x 2 doses.  With Narcan he was able to awaken, but was unsure why he was at the ED. At that time he denied using heroin, reported he does drink about 2 alcoholic drinks daily.   Also noted to have episode of "vomiting blood" in the ED (estimated to be about 250 ml of bloody emesis).  ED course: Initial vital signs: Temperature 97.5 F axillary, respiratory rate 20, pulse 80, blood pressure 152/91, SPO2 96% on room air Significant labs: Potassium 5.2, glucose 97, BUN 26, creatinine 1.13, CK 340, high-sensitivity troponin 16, lactic acid 7.1, WBC 30.3 with neutrophilia Serum ethyl alcohol less than 10 Urine drug screen is pending VBG: pH 7.08/PaCO2 104/PO2 pending/HCO3 30.8 Follow-up VBG: pH 7.31/PCO2 62/PO2 pending/bicarb 31.2 Imaging: CT head without contrast>>1. No acute  intracranial abnormalities. CT Abdomen & Pelvis>> 1. No acute findings are noted in the abdomen or pelvis. 2. However, there is extensive patchy ground-glass attenuation throughout the visualized lung bases. Clinical correlation for signs and symptoms of potential viral pneumonia is recommended. 3. Aortic atherosclerosis, in addition to at least right coronary artery disease. Please note that although the presence of coronary artery calcium documents the presence of coronary artery disease, the severity of this disease and any potential stenosis cannot be assessed on this non-gated CT examination. Assessment for potential risk factor modification, dietary therapy or pharmacologic therapy may be warranted, if clinically indicated. Chest x-ray>>The heart size and mediastinal contours are within normal limits. Redemonstrated left apical scarring. Heterogeneous opacities in the right greater than left lung base. No pleural effusion. No acute osseous abnormality. Remote right ninth and tenth rib fractures. Medications received: Narcan 2 mg x 2 doses, azithromycin, ceftriaxone, 2.5 L of LR boluses; Narcan drip being initiated  PCCM asked to admit.    Pertinent  Medical History  Polysubstance abuse ETOH abuse Traumatic brain injury PTSD Paranoid schizophrenia Bipolar 1 disorder Anxiety Depression Hypertension  Micro Data:  07/07/2021: SARS-CoV-2 and influenza PCR>> negative 07/07/2021: Respiratory viral panel>> 07/07/2021: Blood culture>> 07/07/2021: Strep pneumo urinary antigen>> 07/07/2021: Legionella urinary antigen>> 07/07/2021: Mycoplasma pneumoniae>>  Antimicrobials:  Azithromycin 10/12>> Ceftriaxone 10/12>>  Significant Hospital Events: Including procedures, antibiotic start and stop dates in addition to other pertinent events   07/07/2021: Presented to ED due to altered mental status.  Narcan drip being initiated, PCCM asked to admit  Interim History / Subjective:   -Presented  to ED due to extreme lethargy and altered mental status -Responded briefly to Narcan pushes -Remains very somnolent (arouses briefly to sternal rub and painful stimuli) -Currently protecting his airway, but remains high risk for intubation -Narcan drip to be started -GI consulted for "bloody vomiting" witnessed in ED, estimated to be 250 cc  Objective   Blood pressure (!) 152/91, pulse 77, temperature (!) 97.5 F (36.4 C), temperature source Axillary, resp. rate 20, height 5\' 9"  (1.753 m), weight 61.2 kg, SpO2 96 %.       No intake or output data in the 24 hours ending 07/07/21 1732 Filed Weights   07/07/21 1520  Weight: 61.2 kg    Examination: General: Acutely ill-appearing male, laying in bed, on nasal cannula, no acute distress HENT: Atraumatic, normocephalic, neck supple, no JVD Lungs: Clear diminished to auscultation bilaterally, no wheezing or rales noted, even, nonlabored Cardiovascular: Regular rate and rhythm, S1-S2, no murmurs, rubs, gallops Abdomen: Soft, nontender, nondistended, no guarding rebound tenderness, bowel sounds positive x4 Extremities: No deformities, no edema Neuro: Somnolent, arouses to sternal rub and painful stimuli briefly but then falls back asleep, pupils PERRLA at 2 mm bilaterally GU: Deferred  Resolved Hospital Problem list     Assessment & Plan:   Acute Metabolic Encephalopathy, exact etiology unclear at this time, but suspect due to substance abuse High risk for development of DT's PMHx of Polysubstance abuse, ETOH abuse, Bipolar disorder, Paranoid Schizophrenia, TBI, PTSD, Anxiety -Responsive somewhat to Narcan pushes  -Start Narcan gtt -Avoid sedating meds as able -Provide supportive care -Urine drug screen pending -Ethyl alcohol <10 on admission -Ammonia pending -CT Head negative -CIWA protocol once off Narcan -Thiamine, Folic acid, MVI -Hold home Valium, Cymbalta, Prozac, and Seroquel for now  Sepsis due to Pneumonia  (Aspiration vs. CAP) PRESENT ON ADMISSION -Monitor fever curve -Trend WBC's & Procalcitonin -Follow cultures as above -Continue empiric Azithromycin & Ceftriaxone pending cultures & sensitivities -Trend lactic acid  Acute Hypoxic & Hypercapnic Respiratory Failure in the setting of Pneumonia and Acute Metabolic Encephalopathy -Supplemental O2 as needed to maintain O2 sats >92% -Currently protecting his airway, but remains high risk for intubation -Follow intermittent CXR & ABG as needed -ABX as above -Prn Bronchodilators -Ensure Pulmonary hygiene  Acute GI Bleed Noted to vomit blood in ED (estimated 250 cc) -NPO -Protonix BID IV -Consult GI -Monitor for S/Sx of bleeding -Trend CBC (H&H q12h) -SCD's for VTE Prophylaxis (no chemical prophylaxis) -Transfuse for Hgb <8  Mild Hyperkalemia -Monitor I&O's / urinary output -Follow BMP -Ensure adequate renal perfusion -Avoid nephrotoxic agents as able -Replace electrolytes as indicated -IV fluids -Repeat serum K @ 22:00 tonight    Best Practice (right click and "Reselect all SmartList Selections" daily)   Diet/type: NPO DVT prophylaxis: SCD (no chemical prophylaxis given GI bleed) GI prophylaxis: PPI Lines: N/A Foley:  N/A Code Status:  full code Last date of multidisciplinary goals of care discussion [N/A]  Labs   CBC: Recent Labs  Lab 07/07/21 1526  WBC 30.3*  NEUTROABS 26.0*  HGB 13.7  HCT 42.8  MCV 99.1  PLT 485*    Basic Metabolic Panel: Recent Labs  Lab 07/07/21 1526  NA 140  K 5.2*  CL 97*  CO2 31  GLUCOSE 97  BUN 26*  CREATININE 1.13  CALCIUM 9.8  MG 2.4   GFR: Estimated Creatinine Clearance: 65.4 mL/min (by C-G formula based on SCr of 1.13 mg/dL). Recent Labs  Lab 07/07/21 1526  WBC 30.3*  LATICACIDVEN 7.1*  Liver Function Tests: Recent Labs  Lab 07/07/21 1526  AST 35  ALT 27  ALKPHOS 104  BILITOT 0.4  PROT 8.4*  ALBUMIN 5.0   No results for input(s): LIPASE, AMYLASE in  the last 168 hours. No results for input(s): AMMONIA in the last 168 hours.  ABG    Component Value Date/Time   HCO3 31.2 (H) 07/07/2021 1633   ACIDBASEDEF 2.4 (H) 07/07/2021 1524   O2SAT 41.0 07/07/2021 1633     Coagulation Profile: Recent Labs  Lab 07/07/21 1526  INR 0.9    Cardiac Enzymes: Recent Labs  Lab 07/07/21 1526  CKTOTAL 340    HbA1C: Hgb A1c MFr Bld  Date/Time Value Ref Range Status  11/16/2017 07:25 AM 5.5 4.8 - 5.6 % Final    Comment:    (NOTE) Pre diabetes:          5.7%-6.4% Diabetes:              >6.4% Glycemic control for   <7.0% adults with diabetes     CBG: No results for input(s): GLUCAP in the last 168 hours.  Review of Systems:   Unable to assess due to Altered Mental Status   Past Medical History:  He,  has a past medical history of Anxiety, Bipolar 1 disorder (HCC), Depression, Hypertension, Paranoid schizophrenia (HCC), PTSD (post-traumatic stress disorder), and TBI (traumatic brain injury) (HCC).   Surgical History:   Past Surgical History:  Procedure Laterality Date   ESOPHAGOGASTRODUODENOSCOPY N/A 03/12/2018   Procedure: ESOPHAGOGASTRODUODENOSCOPY (EGD) with removal of food impaction;  Surgeon: Toney Reil, MD;  Location: [facility] ENDOSCOPY;  Service: Gastroenterology;  Laterality: N/A;     Social History:   reports that he has been smoking cigarettes. He has been smoking an average of .5 packs per day. He has never used smokeless tobacco. He reports current alcohol use. He reports current drug use. Drugs: Cocaine and Marijuana.   Family History:  His family history is not on file.   Allergies No Known Allergies   Home Medications  Prior to Admission medications   Medication Sig Start Date End Date Taking? Authorizing Provider  albuterol (PROVENTIL) (2.5 MG/3ML) 0.083% nebulizer solution Inhale into the lungs. Patient not taking: No sig reported 09/04/14   [provider]  albuterol (VENTOLIN HFA) 108 (90  Base) MCG/ACT inhaler Inhale into the lungs. Patient not taking: No sig reported 05/29/15   [provider]  cyclobenzaprine (FLEXERIL) 10 MG tablet Take 1 tablet (10 mg total) by mouth 3 (three) times daily as needed. Patient not taking: No sig reported 10/04/18   Joni Reining, PA-C  diazepam (VALIUM) 5 MG tablet Take by mouth. Patient not taking: No sig reported 12/21/15   [provider]  docusate sodium (COLACE) 100 MG capsule Take by mouth. Patient not taking: No sig reported    [provider]  DULoxetine (CYMBALTA) 60 MG capsule Take 1 capsule (60 mg total) by mouth daily with breakfast. Patient not taking: No sig reported 11/20/17   Pucilowska, [name], MD  FLUoxetine (PROZAC) 20 MG capsule Take 1 capsule by mouth daily. Patient not taking: No sig reported 08/24/15   [provider]  fluticasone-salmeterol (ADVAIR HFA) 230-21 MCG/ACT inhaler Inhale into the lungs. Patient not taking: No sig reported 09/04/14   [provider]  HYDROcodone-acetaminophen (NORCO/VICODIN) 5-325 MG tablet Take 1 tablet by mouth every 6 (six) hours as needed for severe pain. Patient not taking: No sig reported 12/26/19   Manus Rudd,  Lovie Chol, PA-C  hydrOXYzine (ATARAX/VISTARIL) 50 MG tablet Take 1 tablet (50 mg total) by mouth 3 (three) times daily as needed for anxiety. Patient not taking: No sig reported 11/20/17   Pucilowska, [name], MD  ipratropium-albuterol (DUONEB) 0.5-2.5 (3) MG/3ML SOLN Inhale into the lungs. Patient not taking: No sig reported 02/04/14   [provider]  lidocaine (XYLOCAINE) 5 % ointment Apply 5 grams to area up to twice daily Patient not taking: No sig reported 10/28/14   [provider]  lisinopril (PRINIVIL,ZESTRIL) 10 MG tablet Take 1 tablet by mouth daily. Patient not taking: No sig reported 08/24/15   [provider]  megestrol (MEGACE) 40 MG tablet Take by mouth. Patient not taking: No sig reported 08/24/15    [provider]  megestrol (MEGACE) 400 MG/10ML suspension Take 10 mLs (400 mg total) by mouth daily. Patient not taking: No sig reported 11/20/17   Pucilowska, [name], MD  omeprazole (PRILOSEC) 20 MG capsule Take by mouth. Patient not taking: No sig reported 05/29/15   [provider]  omeprazole (PRILOSEC) 40 MG capsule Take 1 capsule (40 mg total) by mouth 2 (two) times daily before a meal. 03/12/18 09/08/18  Vanga, Loel Dubonnet, MD  prazosin (MINIPRESS) 2 MG capsule Take 1 capsule (2 mg total) by mouth 2 (two) times daily. Patient not taking: No sig reported 11/20/17   Pucilowska, [name], MD  predniSONE (STERAPRED UNI-PAK 21 TAB) 10 MG (21) TBPK tablet Take 6 pills on day one then decrease by 1 pill each day Patient not taking: No sig reported 10/29/18   Faythe Ghee, PA-C  QUEtiapine (SEROQUEL) 300 MG tablet Take 1 tablet (300 mg total) by mouth at bedtime. Patient not taking: No sig reported 11/20/17   Pucilowska, [name], MD  QUEtiapine (SEROQUEL) 50 MG tablet Take by mouth. Patient not taking: No sig reported 04/07/16   [provider]  traZODone (DESYREL) 50 MG tablet Take by mouth. Patient not taking: No sig reported 04/12/16   [provider]     Critical care time: 55 minutes     Harlon Ditty, AGACNP-BC [facility] Prefer epic messenger for cross cover needs If after hours, please call E-link

## 2021-07-07 NOTE — ED Provider Notes (Signed)
[facility] Emergency Department Provider Note   ____________________________________________   Event Date/Time   First MD Initiated Contact with Patient 07/07/21 1521     (approximate)  I have reviewed the triage vital signs and the nursing notes.   HISTORY  Chief Complaint Emesis (Pt's friends called EMS for patient acting abnormally with lethargy, dizziness, and weakness. Pt vomiting blood. Per EMS pt had decreased respirations and pinpoint pupils on arrival, given narcan en route. )  EM caveat: Patient not sure why he is here.  Reports he felt perfectly fine until EMS gave him Narcan.  HPI Jairon Ripberger is a 55 y.o. male reports he feels nauseated.  Does not know why he is here.  Seems to suggest he has no idea how EMS was called to his home and that he is somewhat upset that he was given Narcan even though he does not use "heroin" as he describes it  He does use alcohol occasionally reports about 2 drinks daily.  Denies feeling like he is going through alcohol withdrawal  Denies being in any pain or discomfort.  Not sure why he is vomiting but has blood in his vomit Past Medical History:  Diagnosis Date   Anxiety    Bipolar 1 disorder (HCC)    Depression    Hypertension    Paranoid schizophrenia (HCC)    PTSD (post-traumatic stress disorder)    TBI (traumatic brain injury) (HCC)     Patient Active Problem List   Diagnosis Date Noted   Encephalopathy 07/07/2021   Splenic laceration 12/23/2019   Esophageal obstruction due to food impaction    Tobacco use disorder 11/16/2017   Cocaine use disorder, moderate, dependence (HCC) 11/15/2017   Alcohol abuse 11/15/2017   Cannabis use disorder, moderate, dependence (HCC) 11/15/2017   PTSD (post-traumatic stress disorder) 11/15/2017    Past Surgical History:  Procedure Laterality Date   ESOPHAGOGASTRODUODENOSCOPY N/A 03/12/2018   Procedure: ESOPHAGOGASTRODUODENOSCOPY (EGD) with removal of food  impaction;  Surgeon: Toney Reil, MD;  Location: [facility] ENDOSCOPY;  Service: Gastroenterology;  Laterality: N/A;    Prior to Admission medications   Medication Sig Start Date End Date Taking? Authorizing Provider  albuterol (PROVENTIL) (2.5 MG/3ML) 0.083% nebulizer solution Inhale into the lungs. Patient not taking: No sig reported 09/04/14   [provider]  albuterol (VENTOLIN HFA) 108 (90 Base) MCG/ACT inhaler Inhale into the lungs. Patient not taking: No sig reported 05/29/15   [provider]  cyclobenzaprine (FLEXERIL) 10 MG tablet Take 1 tablet (10 mg total) by mouth 3 (three) times daily as needed. Patient not taking: No sig reported 10/04/18   Joni Reining, PA-C  diazepam (VALIUM) 5 MG tablet Take by mouth. Patient not taking: No sig reported 12/21/15   [provider]  docusate sodium (COLACE) 100 MG capsule Take by mouth. Patient not taking: No sig reported    [provider]  DULoxetine (CYMBALTA) 60 MG capsule Take 1 capsule (60 mg total) by mouth daily with breakfast. Patient not taking: No sig reported 11/20/17   Pucilowska, [name], MD  FLUoxetine (PROZAC) 20 MG capsule Take 1 capsule by mouth daily. Patient not taking: No sig reported 08/24/15   [provider]  fluticasone-salmeterol (ADVAIR HFA) 230-21 MCG/ACT inhaler Inhale into the lungs. Patient not taking: No sig reported 09/04/14   [provider]  HYDROcodone-acetaminophen (NORCO/VICODIN) 5-325 MG tablet Take 1 tablet by mouth every 6 (six) hours as needed for severe pain. Patient not  taking: No sig reported 12/26/19   Donovan Kail, PA-C  hydrOXYzine (ATARAX/VISTARIL) 50 MG tablet Take 1 tablet (50 mg total) by mouth 3 (three) times daily as needed for anxiety. Patient not taking: No sig reported 11/20/17   Pucilowska, [name], MD  ipratropium-albuterol (DUONEB) 0.5-2.5 (3) MG/3ML SOLN Inhale into the lungs. Patient not taking: No sig reported 02/04/14    [provider]  lidocaine (XYLOCAINE) 5 % ointment Apply 5 grams to area up to twice daily Patient not taking: No sig reported 10/28/14   [provider]  lisinopril (PRINIVIL,ZESTRIL) 10 MG tablet Take 1 tablet by mouth daily. Patient not taking: No sig reported 08/24/15   [provider]  megestrol (MEGACE) 40 MG tablet Take by mouth. Patient not taking: No sig reported 08/24/15   [provider]  megestrol (MEGACE) 400 MG/10ML suspension Take 10 mLs (400 mg total) by mouth daily. Patient not taking: No sig reported 11/20/17   Pucilowska, [name], MD  omeprazole (PRILOSEC) 20 MG capsule Take by mouth. Patient not taking: No sig reported 05/29/15   [provider]  omeprazole (PRILOSEC) 40 MG capsule Take 1 capsule (40 mg total) by mouth 2 (two) times daily before a meal. 03/12/18 09/08/18  Vanga, Loel Dubonnet, MD  prazosin (MINIPRESS) 2 MG capsule Take 1 capsule (2 mg total) by mouth 2 (two) times daily. Patient not taking: No sig reported 11/20/17   Pucilowska, [name], MD  predniSONE (STERAPRED UNI-PAK 21 TAB) 10 MG (21) TBPK tablet Take 6 pills on day one then decrease by 1 pill each day Patient not taking: No sig reported 10/29/18   Faythe Ghee, PA-C  QUEtiapine (SEROQUEL) 300 MG tablet Take 1 tablet (300 mg total) by mouth at bedtime. Patient not taking: No sig reported 11/20/17   Pucilowska, [name], MD  QUEtiapine (SEROQUEL) 50 MG tablet Take by mouth. Patient not taking: No sig reported 04/07/16   [provider]  traZODone (DESYREL) 50 MG tablet Take by mouth. Patient not taking: No sig reported 04/12/16   [provider]    Allergies Patient has no known allergies.  No family history on file.  Social History Social History   Tobacco Use   Smoking status: Heavy Smoker    Packs/day: 0.50    Types: Cigarettes   Smokeless tobacco: Never  Substance Use Topics   Alcohol use: Yes    Comment: occ   Drug use: Yes     Types: Cocaine, Marijuana    Review of Systems  EM caveat Per the patient he has been fine medically not having issues  ____________________________________________   PHYSICAL EXAM:  VITAL SIGNS: ED Triage Vitals  Enc Vitals Group     BP 07/07/21 1518 (!) 152/91     Pulse Rate 07/07/21 1518 89     Resp 07/07/21 1518 14     Temp 07/07/21 1521 (!) 97.5 F (36.4 C)     Temp Source 07/07/21 1521 Axillary     SpO2 07/07/21 1518 98 %     Weight 07/07/21 1520 135 lb (61.2 kg)     Height 07/07/21 1520 5\' 9"  (1.753 m)     Head Circumference --      Peak Flow --      Pain Score 07/07/21 1516 0     Pain Loc --      Pain Edu? --      Excl. in GC? --     Constitutional: Alert and oriented  to himself and being in a hospital but unaware as to why he is here cannot tell me the date.  He seems slightly somnolent but does answer questions and stays alert but seems somewhat aloof or slightly delirious. Eyes: Conjunctivae are normal.  Pupils are slightly small constricted bilaterally. Head: Atraumatic. Nose: No congestion/rhinnorhea. Mouth/Throat: Mucous membranes are mildly dry, does sit up occasionally having some emesis.  He is noted to have about 250 mL of bloody emesis. Neck: No stridor.  Cardiovascular: Normal rate, regular rhythm. Grossly normal heart sounds.  Good peripheral circulation. Respiratory: Normal respiratory effort.  No retractions. Lungs CTAB.  Occasionally retching. Gastrointestinal: Soft and nontender. No distention.  Actively vomiting, but denies tenderness.  Somewhat appears to be voluntary guarding associated with vomiting and emesis. Musculoskeletal: No lower extremity tenderness nor edema.  Distal extremities feel cool to touch. Neurologic:  Normal speech and language when he speaks 1-2 words but otherwise seems somewhat impaired with regard to level of alertness more somnolent. No gross focal neurologic deficits are appreciated.  Skin:  Skin is warm, dry and  intact. No rash noted. Psychiatric: Mood and affect are fairly calm but gets somewhat upset when he starts talking about Narcan.  ____________________________________________   LABS (all labs ordered are listed, but only abnormal results are displayed)  Labs Reviewed  LACTIC ACID, PLASMA - Abnormal; Notable for the following components:      Result Value   Lactic Acid, Venous 7.1 (*)    All other components within normal limits  COMPREHENSIVE METABOLIC PANEL - Abnormal; Notable for the following components:   Potassium 5.2 (*)    Chloride 97 (*)    BUN 26 (*)    Total Protein 8.4 (*)    All other components within normal limits  CBC WITH DIFFERENTIAL/PLATELET - Abnormal; Notable for the following components:   WBC 30.3 (*)    Platelets 485 (*)    Neutro Abs 26.0 (*)    Monocytes Absolute 2.7 (*)    Abs Immature Granulocytes 0.19 (*)    All other components within normal limits  BLOOD GAS, VENOUS - Abnormal; Notable for the following components:   pH, Ven 7.08 (*)    pCO2, Ven 104 (*)    Bicarbonate 30.8 (*)    Acid-base deficit 2.4 (*)    All other components within normal limits  BLOOD GAS, VENOUS - Abnormal; Notable for the following components:   pCO2, Ven 62 (*)    Bicarbonate 31.2 (*)    Acid-Base Excess 3.5 (*)    All other components within normal limits  RESP PANEL BY RT-PCR (FLU A&B, COVID) ARPGX2  CULTURE, BLOOD (SINGLE)  CULTURE, BLOOD (SINGLE)  RESPIRATORY PANEL BY PCR  PROTIME-INR  APTT  CK  MAGNESIUM  TSH  ETHANOL  PROCALCITONIN  LACTIC ACID, PLASMA  URINALYSIS, ROUTINE W REFLEX MICROSCOPIC  URINE DRUG SCREEN, QUALITATIVE ([facility] ONLY)  CBC  BASIC METABOLIC PANEL  BLOOD GAS, ARTERIAL  PROCALCITONIN  STREP PNEUMONIAE URINARY ANTIGEN  LEGIONELLA PNEUMOPHILA SEROGP 1 UR AG  HEMOGLOBIN AND HEMATOCRIT, BLOOD  MYCOPLASMA PNEUMONIAE ANTIBODY, IGM  LACTIC ACID, PLASMA  HIV ANTIBODY (ROUTINE TESTING W REFLEX)  POTASSIUM  TYPE AND SCREEN  TROPONIN I  (HIGH SENSITIVITY)   ____________________________________________  EKG  Reviewed inter by me at 1525 Heart rate 99 QRS 110 QTc 450 Underlying rhythm felt to be sinus.  Lots of artifact.  No obvious acute ischemia denoted but notable artifact.  Multiple attempts to obtain a EKG with clean  baseline ____________________________________________  RADIOLOGY  CT ABDOMEN PELVIS WO CONTRAST  Result Date: 07/07/2021 CLINICAL DATA:  55 year old male with history of altered mental status. Evaluate for GI bleed. EXAM: CT ABDOMEN AND PELVIS WITHOUT CONTRAST TECHNIQUE: Multidetector CT imaging of the abdomen and pelvis was performed following the standard protocol without IV contrast. COMPARISON:  CT the abdomen and pelvis 02/24/2020. FINDINGS: Lower chest: Widespread but patchy areas of ground-glass attenuation are noted in the visualize lung bases. Atherosclerotic calcifications in the right coronary artery. Hepatobiliary: No definite suspicious cystic or solid hepatic lesions are confidently identified on today's noncontrast CT examination. Unenhanced appearance of the gallbladder is unremarkable. Pancreas: No definite pancreatic mass or peripancreatic fluid collections or inflammatory changes are noted on today's noncontrast CT examination. Spleen: Unremarkable. Adrenals/Urinary Tract: Unenhanced appearance of the kidneys and bilateral adrenal glands is normal. No hydroureteronephrosis. Urinary bladder is moderately distended, but otherwise unremarkable in appearance. Stomach/Bowel: Unenhanced appearance of the stomach is normal. No pathologic dilatation of small bowel or colon. Normal appendix. Vascular/Lymphatic: Aortic atherosclerosis. No definite lymphadenopathy is confidently identified on today's noncontrast CT examination. Reproductive: Prostate gland and seminal vesicles are unremarkable in appearance. Other: No significant volume of ascites.  No pneumoperitoneum. Musculoskeletal: There are no  aggressive appearing lytic or blastic lesions noted in the visualized portions of the skeleton. IMPRESSION: 1. No acute findings are noted in the abdomen or pelvis. 2. However, there is extensive patchy ground-glass attenuation throughout the visualized lung bases. Clinical correlation for signs and symptoms of potential viral pneumonia is recommended. 3. Aortic atherosclerosis, in addition to at least right coronary artery disease. Please note that although the presence of coronary artery calcium documents the presence of coronary artery disease, the severity of this disease and any potential stenosis cannot be assessed on this non-gated CT examination. Assessment for potential risk factor modification, dietary therapy or pharmacologic therapy may be warranted, if clinically indicated. Electronically Signed   By: Trudie Reed M.D.   On: 07/07/2021 16:42   CT HEAD WO CONTRAST ( )  Result Date: 07/07/2021 CLINICAL DATA:  55 year old male with history of altered mental status. EXAM: CT HEAD WITHOUT CONTRAST TECHNIQUE: Contiguous axial images were obtained from the base of the skull through the vertex without intravenous contrast. COMPARISON:  Head CT 12/25/2019. FINDINGS: Brain: No evidence of acute infarction, hemorrhage, hydrocephalus, extra-axial collection or mass lesion/mass effect. Vascular: No hyperdense vessel or unexpected calcification. Skull: Bilateral mildly displaced nasal bone fractures which appear to be chronic based on comparison to prior head CT 07/18/2019. Otherwise, the appearance of the skull is normal. Sinuses/Orbits: No acute finding. Other: None. IMPRESSION: 1. No acute intracranial abnormalities. Electronically Signed   By: Trudie Reed M.D.   On: 07/07/2021 16:45   DG Chest Port 1 View  Result Date: 07/07/2021 CLINICAL DATA:  Questionable sepsis EXAM: PORTABLE CHEST 1 VIEW COMPARISON:  12/23/2019 FINDINGS: The heart size and mediastinal contours are within normal limits.  Redemonstrated left apical scarring. Heterogeneous opacities in the right greater than left lung base. No pleural effusion. No acute osseous abnormality. Remote right ninth and tenth rib fractures. IMPRESSION: Heterogeneous opacities in the right greater than left lung base, which are nonspecific but may represent infection. Electronically Signed   By: Wiliam Ke M.D.   On: 07/07/2021 16:41      CT head reviewed.  Negative for acute finding.  CT abdomen pelvis reviewed no acute findings, however noted are pulmonary findings--- please see full report by radiology ____________________________________________   PROCEDURES  Procedure(s)  performed: None  Procedures  Critical Care performed: Yes, see critical care note(s)  CRITICAL CARE Performed by: Sharyn Creamer   Total critical care time: 50 minutes  Critical care time was exclusive of separately billable procedures and treating other patients.  Critical care was necessary to treat or prevent imminent or life-threatening deterioration.  Critical care was time spent personally by me on the following activities: development of treatment plan with patient and/or surrogate as well as nursing, discussions with consultants, evaluation of patient's response to treatment, examination of patient, obtaining history from patient or surrogate, ordering and performing treatments and interventions, ordering and review of laboratory studies, ordering and review of radiographic studies, pulse oximetry and re-evaluation of patient's condition.  ____________________________________________   INITIAL IMPRESSION / ASSESSMENT AND PLAN / ED COURSE  Pertinent labs & imaging results that were available during my care of the patient were reviewed by me and considered in my medical decision making (see chart for details).   Patient has relative mental status apparently with decreased respiratory rate pinpoint pupils responding well to naloxone with EMS.  The  patient is not able to give a good history and EMS also was unable to collect a solid history on scene reporting that friends its that the patient has a history of polysubstance abuse he was not acting right they were not sure what it happened.  No reported seizure.  Patient initially alert concerned about having received Narcan but then became bradycardia apneic, decreased mental status responded well to additional naloxone given here.  Labs reviewed very concerning with significant respiratory acidosis however this corrected after treating with naloxone and fluid resuscitation.  Additionally, significant leukocytosis chest x-ray imaging concerning for possible infectious etiology for which will cover for potential etiologies.  COVID test negative.  Clinical Course as of 07/07/21 2006  Wed Jul 07, 2021  1605 Patient's respiratory rate has improved to 20.  He alerts to voice.  He denies use of any alcohol or substance abuse.  He does seem to have responded well to naloxone.  His pupils are now midpoint and reactive.  His alertness improved.  No further vomiting.  He currently resting without distress. [MQ]  1634 Patient blood gas noted to show severe acidosis with elevated CO2.  Notable concern for acute respiratory acidosis.  Of note however this was drawn on patient's arrival, and after receiving naloxone his respiratory rate improved and he is currently resting.  He alerts to voice respirations approximately 12 to 16/min without notable distress and clear lung sounds.  We will resend another venous gas.  He does not at this point clearly demonstrate need for mechanical ventilation or noninvasive ventilation/bipap but wish. [MQ]  [id number] Patient was given additional naloxone and his respiratory rate once again seems to have slowed slightly.  After receiving additional naloxone he is alert pin point pupils once again resolved.  His venous blood gas has improved markedly.  He seems to be responding very well to  naloxone, and I am highly concerned that he may have encountered some sort of a potential opioid as a potential overdose.  Given the patient's excellent response to this treatment will place on naloxone infusion.  Also noted is a significantly elevated white blood cell count.  A source of infection [MQ]  1649 Is possible though I question if he could have also potentially aspirated.  Notable leukocytosis may be secondary to infection though certainly other etiologies such as near respiratory arrest are considered.  Will cover empirically with  antibiotic at this point.  I have called the ICU staff to discuss admission. [MQ]  1649 Activating code sepsis [MQ]  1702 Code sepsis initiated for severely elevated lactic acid in the setting of concern for possible pneumonia.  Empiric antibiotic including Rocephin and azithromycin has been ordered.  Consult has been placed with critical care medicine service.  Patient reassessed at this time, venous blood gas improving. Dr. Belia Heman will have ICU team come assess.   Sepsis reassessment.  Patient hemodynamics normal at this time.  Normal respiratory pattern clear lung sounds alerts to voice.  Naloxone drip ordered.  Unclear if 1 underlying etiology or multiple possible.  COVID test is pending.  Await urinalysis and urine drug screen [MQ]  2004 Assessment completed.  Patient resting respiratory rate measured by myself at 15 breaths/min.  He alerts to voice reports that he is feeling okay.  Appears improved from initial.  Skin now warm well perfused.  Resting in no distress lactic acid is cleared.  [MQ]    Clinical Course User Index [MQ] Sharyn Creamer, MD    ----------------------------------------- 3:42 PM on 07/07/2021 ----------------------------------------- Resting very placid, and fairly obtunded at this time. Responds minimally to stimuli.  Respirations diminished respiratory rate approximately 6 breaths/min, O2 saturation 89%.  He does not appear in any  distress but he appears quite obtunded lethargic, pupils are now pinpoint.  EMS reported similar with them and he seems to have diminishment in his mental status.  Nursing to administer additional 2 mg of naloxone and will evaluate for response   Patient be admitted to the ICU service he has multiple potential concerns and though his complete work-up is not done I do feel that he is stable for admission to the ICU service. ____________________________________________   FINAL CLINICAL IMPRESSION(S) / ED DIAGNOSES  Final diagnoses:  Respiratory acidosis  Severe sepsis (HCC)  Community acquired pneumonia, unspecified laterality        Note:  This document was prepared using Conservation officer, historic buildings and may include unintentional dictation errors       Sharyn Creamer, MD 07/07/21 2008

## 2021-07-07 NOTE — Progress Notes (Deleted)
CODE SEPSIS - PHARMACY COMMUNICATION  **Broad Spectrum Antibiotics should be administered within 1 hour of Sepsis diagnosis**  Time Code Sepsis Called/Page Received: 1651  Antibiotics Ordered: azithromycin 500mg  IV daily x 5 days Ceftriaxone 2gms q24hrs  Time of 1st antibiotic administration: 1709  Additional action taken by pharmacy: N/A    1710 ,PharmD Clinical Pharmacist  07/07/2021  4:54 PM sps

## 2021-07-08 ENCOUNTER — Other Ambulatory Visit: Payer: Self-pay

## 2021-07-08 ENCOUNTER — Inpatient Hospital Stay: Payer: Medicaid Other

## 2021-07-08 ENCOUNTER — Encounter: Payer: Self-pay | Admitting: Internal Medicine

## 2021-07-08 DIAGNOSIS — E8729 Other acidosis: Secondary | ICD-10-CM | POA: Diagnosis not present

## 2021-07-08 DIAGNOSIS — G9340 Encephalopathy, unspecified: Secondary | ICD-10-CM

## 2021-07-08 DIAGNOSIS — J189 Pneumonia, unspecified organism: Secondary | ICD-10-CM

## 2021-07-08 LAB — BASIC METABOLIC PANEL
Anion gap: 6 (ref 5–15)
BUN: 15 mg/dL (ref 6–20)
CO2: 27 mmol/L (ref 22–32)
Calcium: 8.5 mg/dL — ABNORMAL LOW (ref 8.9–10.3)
Chloride: 101 mmol/L (ref 98–111)
Creatinine, Ser: 0.73 mg/dL (ref 0.61–1.24)
GFR, Estimated: >60 mL/min (ref >60)
Glucose, Bld: 102 mg/dL — ABNORMAL HIGH (ref 70–99)
Potassium: 4 mmol/L (ref 3.5–5.1)
Sodium: 134 mmol/L — ABNORMAL LOW (ref 135–145)

## 2021-07-08 LAB — GLUCOSE, CAPILLARY
Glucose-Capillary: 79 mg/dL (ref 70–99)
Glucose-Capillary: 83 mg/dL (ref 70–99)

## 2021-07-08 LAB — CBC
HCT: 31.2 % — ABNORMAL LOW (ref 39.0–52.0)
Hemoglobin: 10.7 g/dL — ABNORMAL LOW (ref 13.0–17.0)
MCH: 32.6 pg (ref 26.0–34.0)
MCHC: 34.3 g/dL (ref 30.0–36.0)
MCV: 95.1 fL (ref 80.0–100.0)
Platelets: 353 10*3/uL (ref 150–400)
RBC: 3.28 MIL/uL — ABNORMAL LOW (ref 4.22–5.81)
RDW: 12.4 % (ref 11.5–15.5)
WBC: 22.9 10*3/uL — ABNORMAL HIGH (ref 4.0–10.5)
nRBC: 0 % (ref 0.0–0.2)

## 2021-07-08 LAB — URINALYSIS, ROUTINE W REFLEX MICROSCOPIC
Bacteria, UA: NONE SEEN
Bilirubin Urine: NEGATIVE
Glucose, UA: 50 mg/dL — AB
Ketones, ur: NEGATIVE mg/dL
Nitrite: NEGATIVE
Protein, ur: NEGATIVE mg/dL
Specific Gravity, Urine: 1.004 — ABNORMAL LOW (ref 1.005–1.030)
pH: 7 (ref 5.0–8.0)

## 2021-07-08 LAB — URINE DRUG SCREEN, QUALITATIVE (ARMC ONLY)
Amphetamines, Ur Screen: POSITIVE — AB
Barbiturates, Ur Screen: NOT DETECTED
Benzodiazepine, Ur Scrn: NOT DETECTED
Cannabinoid 50 Ng, Ur ~~LOC~~: POSITIVE — AB
Cocaine Metabolite,Ur ~~LOC~~: POSITIVE — AB
MDMA (Ecstasy)Ur Screen: NOT DETECTED
Methadone Scn, Ur: NOT DETECTED
Opiate, Ur Screen: NOT DETECTED
Phencyclidine (PCP) Ur S: NOT DETECTED
Tricyclic, Ur Screen: NOT DETECTED

## 2021-07-08 LAB — HEMOGLOBIN AND HEMATOCRIT, BLOOD
HCT: 31.5 % — ABNORMAL LOW (ref 39.0–52.0)
HCT: 33.2 % — ABNORMAL LOW (ref 39.0–52.0)
Hemoglobin: 10.7 g/dL — ABNORMAL LOW (ref 13.0–17.0)
Hemoglobin: 11.4 g/dL — ABNORMAL LOW (ref 13.0–17.0)

## 2021-07-08 LAB — HIV ANTIBODY (ROUTINE TESTING W REFLEX): HIV Screen 4th Generation wRfx: NONREACTIVE

## 2021-07-08 LAB — STREP PNEUMONIAE URINARY ANTIGEN: Strep Pneumo Urinary Antigen: NEGATIVE

## 2021-07-08 LAB — PROCALCITONIN: Procalcitonin: 0.83 ng/mL

## 2021-07-08 LAB — LACTIC ACID, PLASMA: Lactic Acid, Venous: 0.8 mmol/L (ref 0.5–1.9)

## 2021-07-08 MED ORDER — STROKE: EARLY STAGES OF RECOVERY BOOK: Freq: Once | Status: AC

## 2021-07-08 MED ORDER — KETOROLAC TROMETHAMINE 30 MG/ML IJ SOLN: 30.0000 mg | Freq: Once | INTRAMUSCULAR | Status: DC

## 2021-07-08 MED ORDER — IOHEXOL 350 MG/ML SOLN
100.0000 mL | Freq: Once | INTRAVENOUS | Status: AC | PRN
  Administered 2021-07-08: 100 mL via INTRAVENOUS

## 2021-07-08 MED ORDER — KETOROLAC TROMETHAMINE 30 MG/ML IJ SOLN
30.0000 mg | Freq: Once | INTRAMUSCULAR | Status: AC
  Administered 2021-07-08: 30 mg via INTRAVENOUS
  Filled 2021-07-08: qty 1

## 2021-07-08 MED ORDER — AZITHROMYCIN 250 MG PO TABS
500.0000 mg | ORAL_TABLET | Freq: Every day | ORAL | Status: DC
  Administered 2021-07-08 – 2021-07-10 (×3): 500 mg via ORAL
  Filled 2021-07-08: qty 1
  Filled 2021-07-08 (×2): qty 2

## 2021-07-08 MED ORDER — STROKE: EARLY STAGES OF RECOVERY BOOK: Freq: Once | Status: DC

## 2021-07-08 NOTE — Progress Notes (Signed)
Chaplain Maggie made initial visit with patient. He shared that he is feeling very tired so he asked me to come back later to talk with him. Chaplain expects to follow up.

## 2021-07-08 NOTE — Consult Note (Signed)
[facility] Telemedicine Consult   Requesting Provider: Army Chaco Consult Participants: PAtient, bedisde nurse Location of the provider: [place], [place] Location of the patient: [facility]  This consult was provided via telemedicine with 2-way video and audio communication. The patient/family was informed that care would be provided in this way and agreed to receive care in this manner.    Chief Complaint: Right sided numbness  HPI: 55 year old male with a history of hypertension, PTSD, states who presents with right-sided numbness and weakness that started abruptly at 1747.  He was on the phone and the nurse was actually in the room with him when the symptoms started.  Due to these acute symptoms a code stroke was activated.  He was taken for an emergent CT/CTA which was negative.  On evaluation, he does have a right hemiparesis, but unfortunately also had a recent GI bleed on admission therefore is not an IV tPA candidate.    LKW: 17:47 tpa given?: No, recent GI bleeding IR Thrombectomy? No, no LVO Time of teleneurologist evaluation: 18:51  Exam: Vitals:   07/08/21 1600 07/08/21 1700  BP: 131/70 134/83  Pulse: 68 66  Resp: 13 12  Temp: 97.6 F (36.4 C)   SpO2: 96% 96%    General: In bed, NAD  1A: Level of Consciousness - 0 1B: Ask Month and Age - 0 1C: 'Blink Eyes' & 'Squeeze Hands' - 0 2: Test Horizontal Extraocular Movements - 0 3: Test Visual Fields - 2 4: Test Facial Palsy - 1 5A: Test Left Arm Motor Drift - 0 5B: Test Right Arm Motor Drift - 0 6A: Test Left Leg Motor Drift - 0 6B: Test Right Leg Motor Drift - 1 7: Test Limb Ataxia - 2 8: Test Sensation - 1 9: Test Language/Aphasia- 0 10: Test Dysarthria - 0 11: Test Extinction/Inattention - 0 NIHSS score: 7   Imaging Reviewed: CT/CTA-negative  Labs reviewed in epic and pertinent values follow: Creatinine 0.73 Glucose 83  Assessment: 55 year old male with recent GI bleed with signs symptoms most  consistent with thalamic stroke.  He will need secondary risk factor modification, but is not a candidate for antiplatelet therapy at the current time.  He is also unfortunately not a candidate for any type of acute therapy.   Recommendations:  1) MRI brain 2) PT, OT, ST 3) lipids, A1c 4) cocaine cessation 5) neurology will follow    This patient is receiving care for possible acute neurological changes. There was 35 minutes of care by this provider at the time of service, including time for direct evaluation via telemedicine, review of medical records, imaging studies and discussion of findings with providers, the patient and/or family.  Ritta Slot, MD [facility] [phone]  If 7pm- 7am, please page neurology on call as listed in AMION.

## 2021-07-08 NOTE — Progress Notes (Signed)
PHARMACIST - PHYSICIAN COMMUNICATION  CONCERNING: Antibiotic IV to Oral Route Change Policy  RECOMMENDATION: This patient is receiving azithromycin by the intravenous route.  Based on criteria approved by the Pharmacy and Therapeutics Committee, the antibiotic(s) is/are being converted to the equivalent oral dose form(s).   DESCRIPTION: These criteria include: Patient being treated for a respiratory tract infection, urinary tract infection, cellulitis or clostridium difficile associated diarrhea if on metronidazole The patient is not neutropenic and does not exhibit a GI malabsorption state The patient is eating (either orally or via tube) and/or has been taking other orally administered medications for a least 24 hours The patient is improving clinically and has a Tmax < 100.5  If you have questions about this conversion, please contact the Pharmacy Department   Bill Villanueva B Bill Villanueva  07/08/21    

## 2021-07-08 NOTE — Consult Note (Signed)
GI Inpatient Consult Note  Reason for Consult: Upper GI Bleed   Attending Requesting Consult: Harlon Ditty, NP  History of Present Illness: Bill Villanueva is a 55 y.o. male seen for evaluation of acute upper GI bleed at the request of Harlon Ditty, NP. Pt has a PMH of PTSD, paranoid schizophrenia, Bipolar I disorder, HTN, Hx of TBI, and polysubstance abuse. He presented to the [facility] ED via EMS yesterday afternoon for altered mental status. Upon arrival to the ED, he had RR 20, HR 80, BP 152/91, and O2 sats 96% on room air. Labs were significant for K 5.2, glucose 97, BUN 26, creatinine 1.13, hemoglobin 13.7, CK 340, lactic acid 7.1, WBC 30.3 with neutrophilia, ethyl alcohol <10, and UDS + for amphetamines, cocaine, and cannabinoid.  CT head without contrast was negative. CT abd/pelvis showed no acute findings. He was diagnosed with acute metabolic encephalopathy due to polysubstance intoxication requiring Narcan drip, along with acute hypoxic and hypercapnic respiratory failure and sepsis likely 2/2 aspiration pneumonia. After administration of Narcan his mental status improved. Per ED nurse, there was one episode of bloody emesis estimated to be ~250 cc yesterday evening. GI was consulted in context for concern of upper GI bleed.   Patient seen and examined this morning resting comfortably in hospital bed. No acute events overnight. There have been no further episodes of frank hematemesis. No overt melena or hematochezia. Hemoglobin this morning 10.7 and has been stable. He has no other acute complaints. He denies any fever, nausea, recurrent vomiting, dysphagia, or indigestion. He has weaned off the Narcan gtt. He does not remember the events that led him to the hospital. Plan is for him to go to the floor today. He lives with his fiance. Previous EGD 02/2018 for esophageal food impaction where Dr. Allegra Lai performed foreign body removal. There was also findings of few superficial esophageal ulcers  with eschar with no bleeding in lower third of esophagus   Past Medical History:  Past Medical History:  Diagnosis Date   Anxiety    Bipolar 1 disorder (HCC)    Depression    Hypertension    Paranoid schizophrenia (HCC)    PTSD (post-traumatic stress disorder)    TBI (traumatic brain injury)     Problem List: Patient Active Problem List   Diagnosis Date Noted   Encephalopathy 07/07/2021   Splenic laceration 12/23/2019   Esophageal obstruction due to food impaction    Tobacco use disorder 11/16/2017   Cocaine use disorder, moderate, dependence (HCC) 11/15/2017   Alcohol abuse 11/15/2017   Cannabis use disorder, moderate, dependence (HCC) 11/15/2017   PTSD (post-traumatic stress disorder) 11/15/2017    Past Surgical History: Past Surgical History:  Procedure Laterality Date   ESOPHAGOGASTRODUODENOSCOPY N/A 03/12/2018   Procedure: ESOPHAGOGASTRODUODENOSCOPY (EGD) with removal of food impaction;  Surgeon: Toney Reil, MD;  Location: [facility] ENDOSCOPY;  Service: Gastroenterology;  Laterality: N/A;    Allergies: No Known Allergies  Home Medications: Medications Prior to Admission  Medication Sig Dispense Refill Last Dose   albuterol (PROVENTIL) (2.5 MG/3ML) 0.083% nebulizer solution Inhale into the lungs. (Patient not taking: No sig reported)   Not Taking   albuterol (VENTOLIN HFA) 108 (90 Base) MCG/ACT inhaler Inhale into the lungs. (Patient not taking: No sig reported)   Not Taking   cyclobenzaprine (FLEXERIL) 10 MG tablet Take 1 tablet (10 mg total) by mouth 3 (three) times daily as needed. (Patient not taking: No sig reported) 15 tablet 0    diazepam (VALIUM) 5  MG tablet Take by mouth. (Patient not taking: No sig reported)   Not Taking   docusate sodium (COLACE) 100 MG capsule Take by mouth. (Patient not taking: No sig reported)   Not Taking   DULoxetine (CYMBALTA) 60 MG capsule Take 1 capsule (60 mg total) by mouth daily with breakfast. (Patient not taking: No sig  reported) 30 capsule 3    FLUoxetine (PROZAC) 20 MG capsule Take 1 capsule by mouth daily. (Patient not taking: No sig reported)   Not Taking   fluticasone-salmeterol (ADVAIR HFA) 230-21 MCG/ACT inhaler Inhale into the lungs. (Patient not taking: No sig reported)   Not Taking   HYDROcodone-acetaminophen (NORCO/VICODIN) 5-325 MG tablet Take 1 tablet by mouth every 6 (six) hours as needed for severe pain. (Patient not taking: No sig reported) 20 tablet 0 Not Taking   hydrOXYzine (ATARAX/VISTARIL) 50 MG tablet Take 1 tablet (50 mg total) by mouth 3 (three) times daily as needed for anxiety. (Patient not taking: No sig reported) 90 tablet 1    ipratropium-albuterol (DUONEB) 0.5-2.5 (3) MG/3ML SOLN Inhale into the lungs. (Patient not taking: No sig reported)   Not Taking   lidocaine (XYLOCAINE) 5 % ointment Apply 5 grams to area up to twice daily (Patient not taking: No sig reported)   Not Taking   lisinopril (PRINIVIL,ZESTRIL) 10 MG tablet Take 1 tablet by mouth daily. (Patient not taking: No sig reported)   Not Taking   megestrol (MEGACE) 40 MG tablet Take by mouth. (Patient not taking: No sig reported)   Not Taking   megestrol (MEGACE) 400 MG/10ML suspension Take 10 mLs (400 mg total) by mouth daily. (Patient not taking: No sig reported) 240 mL 0    omeprazole (PRILOSEC) 20 MG capsule Take by mouth. (Patient not taking: No sig reported)   Not Taking   omeprazole (PRILOSEC) 40 MG capsule Take 1 capsule (40 mg total) by mouth 2 (two) times daily before a meal. 180 capsule 1    prazosin (MINIPRESS) 2 MG capsule Take 1 capsule (2 mg total) by mouth 2 (two) times daily. (Patient not taking: No sig reported) 60 capsule 1    predniSONE (STERAPRED UNI-PAK 21 TAB) 10 MG (21) TBPK tablet Take 6 pills on day one then decrease by 1 pill each day (Patient not taking: No sig reported) 21 tablet 0    QUEtiapine (SEROQUEL) 300 MG tablet Take 1 tablet (300 mg total) by mouth at bedtime. (Patient not taking: No sig  reported) 30 tablet 1    QUEtiapine (SEROQUEL) 50 MG tablet Take by mouth. (Patient not taking: No sig reported)   Not Taking   traZODone (DESYREL) 50 MG tablet Take by mouth. (Patient not taking: No sig reported)   Not Taking   Home medication reconciliation was completed with the patient.   Scheduled Inpatient Medications:    Chlorhexidine Gluconate Cloth  6 each Topical Q0600   folic acid  1 mg Oral Daily   multivitamin with minerals  1 tablet Oral Daily   pantoprazole (PROTONIX) IV  40 mg Intravenous Q12H   sodium chloride flush  3 mL Intravenous Q12H   thiamine injection  100 mg Intravenous Q24H    Continuous Inpatient Infusions:    sodium chloride     azithromycin Stopped (07/07/21 1821)   cefTRIAXone (ROCEPHIN)  IV Stopped (07/07/21 1739)    PRN Inpatient Medications:  sodium chloride, acetaminophen, docusate sodium, ondansetron (ZOFRAN) IV, polyethylene glycol, sodium chloride flush  Family History: family history is not on  file.  The patient's family history is negative for inflammatory bowel disorders, GI malignancy, or solid organ transplantation.  Social History:   reports that he has been smoking cigarettes. He has been smoking an average of .5 packs per day. He has never used smokeless tobacco. He reports current alcohol use. He reports current drug use. Drugs: Cocaine and Marijuana. The patient denies ETOH, tobacco, or drug use.   Review of Systems: Constitutional: Weight is stable.  Eyes: No changes in vision. ENT: No oral lesions, sore throat.  GI: see HPI.  Heme/Lymph: No easy bruising.  CV: No chest pain.  GU: No hematuria.  Integumentary: No rashes.  Neuro: No headaches.  Psych: No depression/anxiety.  Endocrine: No heat/cold intolerance.  Allergic/Immunologic: No urticaria.  Resp: No cough, SOB.  Musculoskeletal: No joint swelling.    Physical Examination: BP 122/78   Pulse 65   Temp 98 F (36.7 C) (Oral)   Resp 14   Ht 5\' 9"  (1.753 m)   Wt  57.2 kg   SpO2 95%   BMI 18.62 kg/m  Tired appearing male in hospital bed on nasal cannula. No acute distress. No accessory muscle upon breathing.  Gen: NAD, alert and oriented x 4 HEENT: PEERLA, EOMI, Neck: supple, no JVD or thyromegaly Chest: CTA bilaterally, no wheezes, crackles, or other adventitious sounds CV: RRR, no m/g/c/r Abd: soft, nondistended, +BS in all four quadrants; tender to deep palpation in RLQ, LUQ, and suprapubic regions, no HSM, guarding, ridigity, or rebound tenderness Ext: no edema, well perfused with 2+ pulses, Skin: no rash or lesions noted Lymph: no LAD  Data: Lab Results  Component Value Date   WBC 22.9 (H) 07/08/2021   HGB 10.7 (L) 07/08/2021   HCT 31.5 (L) 07/08/2021   MCV 95.1 07/08/2021   PLT 353 07/08/2021   Recent Labs  Lab 07/07/21 1526 07/08/21 0211 07/08/21 0956  HGB 13.7 10.7* 10.7*   Lab Results  Component Value Date   NA 134 (L) 07/08/2021   K 4.0 07/08/2021   CL 101 07/08/2021   CO2 27 07/08/2021   BUN 15 07/08/2021   CREATININE 0.73 07/08/2021   Lab Results  Component Value Date   ALT 27 07/07/2021   AST 35 07/07/2021   ALKPHOS 104 07/07/2021   BILITOT 0.4 07/07/2021   Recent Labs  Lab 07/07/21 1526  APTT 34  INR 0.9   Assessment/Plan:  55 y/o Caucasian male with a PMH of PTSD, paranoid schizophrenia, Bipolar I disorder, HTN, Hx of TBI, and polysubstance abuse admitted to [facility] yesterday evening for acute metabolic encephalopathy due to polysubstance intoxication (UDS + for amphetamines, cocaine, and cannabinoids) requiring Narcan gtt along with acute hypoxic and hypercapnic respiratory failure and sepsis 2/2 aspiration pneumonia. GI consulted for concerns of acute UGIB   Upper GI Bleed - one episode of frank hematemesis ~250 cc in the ED yesterday with no recurrent hematemesis. H&H stable and drop from 13.7-->10.7 likely hemodilution in setting of aggressive IV fluid resuscitation. DDx includes Mallory-Weiss tear,  esophageal ulcers, peptic ulcer disease, gastritis, duodenitis, Dieulafoy's lesion, less likely varices, occult GI neoplasm, etc.   Acute metabolic encephalopathy 2/2 polysubstance intoxication - UDS + for amphetamines, cocaine, and cannabinoids  Sepsis 2/2 aspiration pneumonia    Polysubstance abuse  Hyperkalemia - resolved   Recommendations:  - H&H stable with no precipitous drop in hemoglobin or signs of overt gastrointestinal bleeding - Continue to monitor serial H&H. Transfuse for Hgb <8.0.  - Continue Protonix IV for gastric  protection - Monitor for signs and symptoms of gastrointestinal bleeding - No plans for emergent endoscopy at present time given lack of overt GIB, precipitous drop in hemoglobin, and risks of anesthesia with + UDS - If he develops acute GI bleeding, please call Dr. Norma Fredrickson.  - Regular diet okay for now - Following for now with you  Thank you for the consult. Please call with questions or concerns.  Mickle Mallory [facility] [phone] (Cell)

## 2021-07-08 NOTE — Progress Notes (Signed)
NAME:  Bill Villanueva, MRN:  [id number], DOB:  02-18-1966, LOS: 1 ADMISSION DATE:  07/07/2021, CONSULTATION DATE:  07/07/2021 REFERRING MD:  Dr. Fanny Bien, CHIEF COMPLAINT: Altered mental status  Brief Pt Description / Synopsis:  55 year old male admitted with acute metabolic encephalopathy due to polysubstance intoxication (UDS + for amphetamines, cocaine, cannabinoid) requiring Narcan drip, along with acute hypoxic & hypercapnic respiratory failure and sepsis due to pneumonia (aspiration versus CAP), along with Acute Upper GI Bleed.  History of Present Illness:  Bill Villanueva's BiV is a 55 year old male with a past medical history as listed below who presented to [facility] ED on 07/07/2021 due to altered mental status.  Patient remains altered and no family is available, therefore history is obtained from ED nursing notes.  Per notes the patient's friends called EMS as he was not acting normally, was noted to be lethargic, dizzy, and weak.  He received Narcan with EMS in route to the hospital.  Upon arrival to the ED he exhibited decreased respirations, pinpoint pupils, and was also noted to be hypoxic with O2 saturations 89% on room air.  Subsequently he was given Narcan 2 mg x 2 doses.  With Narcan he was able to awaken with noted improvement in respirations,  but was unsure why he was at the ED. At that time he denied using heroin, reported he does drink about 2 alcoholic drinks daily.   Also noted to have episode of "vomiting blood" in the ED (estimated to be about 250 ml of bloody emesis).  ED course: Initial vital signs: Temperature 97.5 F axillary, respiratory rate 20, pulse 80, blood pressure 152/91, SPO2 96% on room air Significant labs: Potassium 5.2, glucose 97, BUN 26, creatinine 1.13, CK 340, high-sensitivity troponin 16, lactic acid 7.1, WBC 30.3 with neutrophilia Serum ethyl alcohol less than 10 Urine drug screen is pending VBG: pH 7.08/PaCO2 104/PO2 pending/HCO3 30.8 Follow-up VBG: pH  7.31/PCO2 62/PO2 pending/bicarb 31.2 Imaging: CT head without contrast>>1. No acute intracranial abnormalities. CT Abdomen & Pelvis>> 1. No acute findings are noted in the abdomen or pelvis. 2. However, there is extensive patchy ground-glass attenuation throughout the visualized lung bases. Clinical correlation for signs and symptoms of potential viral pneumonia is recommended. 3. Aortic atherosclerosis, in addition to at least right coronary artery disease. Please note that although the presence of coronary artery calcium documents the presence of coronary artery disease, the severity of this disease and any potential stenosis cannot be assessed on this non-gated CT examination. Assessment for potential risk factor modification, dietary therapy or pharmacologic therapy may be warranted, if clinically indicated. Chest x-ray>>The heart size and mediastinal contours are within normal limits. Redemonstrated left apical scarring. Heterogeneous opacities in the right greater than left lung base. No pleural effusion. No acute osseous abnormality. Remote right ninth and tenth rib fractures. Medications received: Narcan 2 mg x 2 doses, azithromycin, ceftriaxone, 2.5 L of LR boluses; Narcan drip being initiated  PCCM asked to admit.    Pertinent  Medical History  Polysubstance abuse ETOH abuse Traumatic brain injury PTSD Paranoid schizophrenia Bipolar 1 disorder Anxiety Depression Hypertension  Micro Data:  07/07/2021: SARS-CoV-2 and influenza PCR>> negative 07/07/2021: Respiratory viral panel>>negative 07/07/2021: Blood culture>> 07/07/2021: MRSA PCR>>POSITIVE 07/07/2021: Strep pneumo urinary antigen>> 07/07/2021: Legionella urinary antigen>> 07/07/2021: Mycoplasma pneumoniae>>  Antimicrobials:  Azithromycin 10/12>> Ceftriaxone 10/12>>  Significant Hospital Events: Including procedures, antibiotic start and stop dates in addition to other pertinent events   07/07/2021: Presented  to ED due to altered mental status.  Narcan  drip being initiated, PCCM asked to admit. UDS+ for Amphetamines, cocaine, marijuana 07/08/2021: Weaned off Narcan gtt.  Hemodynamically stable.  Plan to transfer to Med-Surg unit.  Interim History / Subjective:  -No acute events reported overnight -Weaned off Narcan gtt earlier this morning -Afebrile, hemodynamically stable, on 2L  -No reports of GI bleeding overnight, Hgb decreased to 10.7 (13.7 yesterday) ~ suspect some dilution with aggressive IVF resuscitation -Pt has no recollection of events leading him to be hospitalized -Denies chest pain, SOB, N/V/D, dysuria, fever, chills  Objective   Blood pressure (!) 141/84, pulse 68, temperature 98 F (36.7 C), temperature source Oral, resp. rate 14, height 5\' 9"  (1.753 m), weight 57.2 kg, SpO2 94 %.        Intake/Output Summary (Last 24 hours) at 07/08/2021 0723 Last data filed at 07/08/2021 0400 Gross per 24 hour  Intake 4744.34 ml  Output 1500 ml  Net 3244.34 ml   Filed Weights   07/07/21 1520 07/07/21 2030  Weight: 61.2 kg 57.2 kg    Examination: General: Acutely ill-appearing male, laying in bed, on nasal cannula, no acute distress HENT: Atraumatic, normocephalic, neck supple, no JVD Lungs: Clear diminished to auscultation bilaterally, no wheezing or rales noted, even, nonlabored Cardiovascular: Regular rate and rhythm, S1-S2, no murmurs, rubs, gallops Abdomen: Soft, nontender, nondistended, no guarding rebound tenderness, bowel sounds positive x4 Extremities: No deformities, no edema Neuro: Sleeping, arouses easily to voice, Oriented to person, place, and time. Follows commands, no focal deficits, speech clear, , pupils PERRLA  GU: Deferred  Resolved Hospital Problem list     Assessment & Plan:   Acute Metabolic Encephalopathy, due to polysubstance intoxication (UDS + for Amphetamines, Cocaine, Cannabinoid) High risk for development of DT's PMHx of Polysubstance abuse,  ETOH abuse, Bipolar disorder, Paranoid Schizophrenia, TBI, PTSD, Anxiety -Narcan gtt weaned off -Avoid sedating meds as able -Provide supportive care -Urine drug screen + for Amphetamines, Cocaine, Cannabinoid -Ethyl alcohol <10 on admission -CT Head negative -CIWA protocol once off Narcan -Thiamine, Folic acid, MVI -Hold home Valium, Cymbalta, Prozac, and Seroquel for now  Sepsis due to Pneumonia (Aspiration vs. CAP) PRESENT ON ADMISSION -Monitor fever curve -Trend WBC's & Procalcitonin -Follow cultures as above -Continue empiric Azithromycin & Ceftriaxone pending cultures & sensitivities -Lactic acid now normalized  Acute Hypoxic & Hypercapnic Respiratory Failure in the setting of Pneumonia and Acute Metabolic Encephalopathy -Supplemental O2 as needed to maintain O2 sats >92% -Follow intermittent CXR & ABG as needed -ABX as above -Prn Bronchodilators -Ensure Pulmonary hygiene  Acute GI Bleed Noted to vomit blood in ED (estimated 250 cc) -NPO -Protonix BID IV -Consult GI, appreciate input -Monitor for S/Sx of bleeding -Trend CBC (H&H q12h) -SCD's for VTE Prophylaxis (no chemical prophylaxis) -Transfuse for Hgb <8  Mild Hyperkalemia ~ resolved -Monitor I&O's / urinary output -Follow BMP -Ensure adequate renal perfusion -Avoid nephrotoxic agents as able -Replace electrolytes as indicated     Best Practice (right click and "Reselect all SmartList Selections" daily)   Diet/type: NPO DVT prophylaxis: SCD (no chemical prophylaxis given GI bleed) GI prophylaxis: PPI Lines: N/A Foley:  N/A Code Status:  full code Last date of multidisciplinary goals of care discussion [07/08/2021]  Labs   CBC: Recent Labs  Lab 07/07/21 1526 07/08/21 0211  WBC 30.3* 22.9*  NEUTROABS 26.0*  --   HGB 13.7 10.7*  HCT 42.8 31.2*  MCV 99.1 95.1  PLT 485* 353     Basic Metabolic Panel: Recent Labs  Lab 07/07/21 1526 07/08/21  0211  NA 140 134*  K 5.2* 4.0  CL 97* 101   CO2 31 27  GLUCOSE 97 102*  BUN 26* 15  CREATININE 1.13 0.73  CALCIUM 9.8 8.5*  MG 2.4  --     GFR: Estimated Creatinine Clearance: 86.4 mL/min (by C-G formula based on SCr of 0.73 mg/dL). Recent Labs  Lab 07/07/21 1526 07/07/21 1815 07/08/21 0211  PROCALCITON 0.38  --  0.83  WBC 30.3*  --  22.9*  LATICACIDVEN 7.1* 1.7 0.8     Liver Function Tests: Recent Labs  Lab 07/07/21 1526  AST 35  ALT 27  ALKPHOS 104  BILITOT 0.4  PROT 8.4*  ALBUMIN 5.0    No results for input(s): LIPASE, AMYLASE in the last 168 hours. No results for input(s): AMMONIA in the last 168 hours.  ABG    Component Value Date/Time   HCO3 31.2 (H) 07/07/2021 1633   ACIDBASEDEF 2.4 (H) 07/07/2021 1524   O2SAT 41.0 07/07/2021 1633      Coagulation Profile: Recent Labs  Lab 07/07/21 1526  INR 0.9     Cardiac Enzymes: Recent Labs  Lab 07/07/21 1526  CKTOTAL 340     HbA1C: Hgb A1c MFr Bld  Date/Time Value Ref Range Status  11/16/2017 07:25 AM 5.5 4.8 - 5.6 % Final    Comment:    (NOTE) Pre diabetes:          5.7%-6.4% Diabetes:              >6.4% Glycemic control for   <7.0% adults with diabetes     CBG: No results for input(s): GLUCAP in the last 168 hours.  Review of Systems:   Positives in BOLD: Pt currently denies all complaints Gen: Denies fever, chills, weight change, fatigue, night sweats HEENT: Denies blurred vision, double vision, hearing loss, tinnitus, sinus congestion, rhinorrhea, sore throat, neck stiffness, dysphagia PULM: Denies shortness of breath, cough, sputum production, hemoptysis, wheezing CV: Denies chest pain, edema, orthopnea, paroxysmal nocturnal dyspnea, palpitations GI: Denies abdominal pain, nausea, vomiting, diarrhea, hematochezia, melena, constipation, change in bowel habits GU: Denies dysuria, hematuria, polyuria, oliguria, urethral discharge Endocrine: Denies hot or cold intolerance, polyuria, polyphagia or appetite change Derm: Denies  rash, dry skin, scaling or peeling skin change Heme: Denies easy bruising, bleeding, bleeding gums Neuro: Denies headache, numbness, weakness, slurred speech, loss of memory or consciousness   Past Medical History:  He,  has a past medical history of Anxiety, Bipolar 1 disorder (HCC), Depression, Hypertension, Paranoid schizophrenia (HCC), PTSD (post-traumatic stress disorder), and TBI (traumatic brain injury) (HCC).   Surgical History:   Past Surgical History:  Procedure Laterality Date   ESOPHAGOGASTRODUODENOSCOPY N/A 03/12/2018   Procedure: ESOPHAGOGASTRODUODENOSCOPY (EGD) with removal of food impaction;  Surgeon: Toney Reil, MD;  Location[facility] ENDOSCOPY;  Service: Gastroenterology;  Laterality: N/A;     Social History:   reports that he has been smoking cigarettes. He has been smoking an average of .5 packs per day. He has never used smokeless tobacco. He reports current alcohol use. He reports current drug use. Drugs: Cocaine and Marijuana.   Family History:  His family history is not on file.   Allergies No Known Allergies   Home Medications  Prior to Admission medications   Medication Sig Start Date End Date Taking? Authorizing Provider  albuterol (PROVENTIL) (2.5 MG/3ML) 0.083% nebulizer solution Inhale into the lungs. Patient not taking: No sig reported 09/04/14   [provider]  albuterol (VENTOLIN HFA) 108 (90  Base) MCG/ACT inhaler Inhale into the lungs. Patient not taking: No sig reported 05/29/15   [provider]  cyclobenzaprine (FLEXERIL) 10 MG tablet Take 1 tablet (10 mg total) by mouth 3 (three) times daily as needed. Patient not taking: No sig reported 10/04/18   Joni Reining, PA-C  diazepam (VALIUM) 5 MG tablet Take by mouth. Patient not taking: No sig reported 12/21/15   [provider]  docusate sodium (COLACE) 100 MG capsule Take by mouth. Patient not taking: No sig reported    [provider]  DULoxetine  (CYMBALTA) 60 MG capsule Take 1 capsule (60 mg total) by mouth daily with breakfast. Patient not taking: No sig reported 11/20/17   Pucilowska, [name], MD  FLUoxetine (PROZAC) 20 MG capsule Take 1 capsule by mouth daily. Patient not taking: No sig reported 08/24/15   [provider]  fluticasone-salmeterol (ADVAIR HFA) 230-21 MCG/ACT inhaler Inhale into the lungs. Patient not taking: No sig reported 09/04/14   [provider]  HYDROcodone-acetaminophen (NORCO/VICODIN) 5-325 MG tablet Take 1 tablet by mouth every 6 (six) hours as needed for severe pain. Patient not taking: No sig reported 12/26/19   Donovan Kail, PA-C  hydrOXYzine (ATARAX/VISTARIL) 50 MG tablet Take 1 tablet (50 mg total) by mouth 3 (three) times daily as needed for anxiety. Patient not taking: No sig reported 11/20/17   Pucilowska, [name], MD  ipratropium-albuterol (DUONEB) 0.5-2.5 (3) MG/3ML SOLN Inhale into the lungs. Patient not taking: No sig reported 02/04/14   [provider]  lidocaine (XYLOCAINE) 5 % ointment Apply 5 grams to area up to twice daily Patient not taking: No sig reported 10/28/14   [provider]  lisinopril (PRINIVIL,ZESTRIL) 10 MG tablet Take 1 tablet by mouth daily. Patient not taking: No sig reported 08/24/15   [provider]  megestrol (MEGACE) 40 MG tablet Take by mouth. Patient not taking: No sig reported 08/24/15   [provider]  megestrol (MEGACE) 400 MG/10ML suspension Take 10 mLs (400 mg total) by mouth daily. Patient not taking: No sig reported 11/20/17   Pucilowska, [name], MD  omeprazole (PRILOSEC) 20 MG capsule Take by mouth. Patient not taking: No sig reported 05/29/15   [provider]  omeprazole (PRILOSEC) 40 MG capsule Take 1 capsule (40 mg total) by mouth 2 (two) times daily before a meal. 03/12/18 09/08/18  Vanga, Loel Dubonnet, MD  prazosin (MINIPRESS) 2 MG capsule Take 1 capsule (2 mg total) by mouth 2 (two) times  daily. Patient not taking: No sig reported 11/20/17   Pucilowska, [name], MD  predniSONE (STERAPRED UNI-PAK 21 TAB) 10 MG (21) TBPK tablet Take 6 pills on day one then decrease by 1 pill each day Patient not taking: No sig reported 10/29/18   Faythe Ghee, PA-C  QUEtiapine (SEROQUEL) 300 MG tablet Take 1 tablet (300 mg total) by mouth at bedtime. Patient not taking: No sig reported 11/20/17   Pucilowska, [name], MD  QUEtiapine (SEROQUEL) 50 MG tablet Take by mouth. Patient not taking: No sig reported 04/07/16   [provider]  traZODone (DESYREL) 50 MG tablet Take by mouth. Patient not taking: No sig reported 04/12/16   [provider]     Critical care time: 38 minutes     Harlon Ditty, AGACNP-BC [facility] Prefer epic messenger for cross cover needs If after hours, please call E-link

## 2021-07-08 NOTE — Progress Notes (Addendum)
BRIEF CRITICAL CARE NOTE  Was called to bedside by nursing as pt complaining of right sided NUMBNESS.  Last known well time was 17:47.  Upon arrival, pt is in no distress, hemodynamically stable. Continues to complain of right sided numbness.  Noted to have the slightest weakness to the RLE.  Speech is clear, no facial droop, pupils PERRLA. CBG checked ~ 83.  CODE STROKE called.  Placed order for STAT HEAD CT without contrast.  TeleNeurology to evaluate need for fibrinolytic's.  Will defer to TeleNeurology for further recommendations and plan of care.       Additional Critical Care Time: 25 minutes  Harlon Ditty, AGACNP-BC [facility] Prefer epic messenger for cross cover needs If after hours, please call E-link

## 2021-07-08 NOTE — Progress Notes (Signed)
   07/08/21 1810  Clinical Encounter Type  Visited With Patient  Visit Type Initial;Spiritual support  Referral From Nurse  Consult/Referral To Chaplain  Spiritual Encounters  Spiritual Needs Prayer;Emotional  Chaplain Oleta Mouse responded to a Code Stroke. Medical team checking Pt. Pt enroute to the Lab for further testing. I provided emotional and spiritual support.No family members were present.

## 2021-07-08 NOTE — Progress Notes (Signed)
1747 patient reports numbess in right side from face to feet no other complications noted 1800 patient taken to CT scan 18g placed in CT n 1900 neuro test completed with neurologist

## 2021-07-08 NOTE — Plan of Care (Signed)
  Problem: Education: Goal: Knowledge of General Education information will improve Description: Including pain rating scale, medication(s)/side effects and non-pharmacologic comfort measures Outcome: Progressing   Problem: Health Behavior/Discharge Planning: Goal: Ability to manage health-related needs will improve Outcome: Progressing   Problem: Clinical Measurements: Goal: Ability to maintain clinical measurements within normal limits will improve Outcome: Progressing Goal: Will remain free from infection Outcome: Progressing Goal: Diagnostic test results will improve Outcome: Progressing Goal: Respiratory complications will improve Outcome: Progressing Goal: Cardiovascular complication will be avoided Outcome: Progressing   Problem: Activity: Goal: Risk for activity intolerance will decrease Outcome: Progressing   Problem: Nutrition: Goal: Adequate nutrition will be maintained Outcome: Progressing   Problem: Coping: Goal: Level of anxiety will decrease Outcome: Progressing   Problem: Elimination: Goal: Will not experience complications related to bowel motility Outcome: Progressing Goal: Will not experience complications related to urinary retention Outcome: Progressing   Problem: Pain Managment: Goal: General experience of comfort will improve Outcome: Progressing   Problem: Safety: Goal: Ability to remain free from injury will improve Outcome: Progressing   Problem: Skin Integrity: Goal: Risk for impaired skin integrity will decrease Outcome: Progressing Patient alert following command able to feed self, plan to continue ABT for PNA pt aware and agreeable to plan of care

## 2021-07-09 ENCOUNTER — Inpatient Hospital Stay: Payer: Medicaid Other

## 2021-07-09 ENCOUNTER — Other Ambulatory Visit: Payer: Self-pay

## 2021-07-09 ENCOUNTER — Inpatient Hospital Stay (HOSPITAL_COMMUNITY)
Admit: 2021-07-09 | Discharge: 2021-07-09 | Disposition: A | Discharge status: Home / Self Care | Payer: Medicaid Other | Attending: Neurology | Admitting: Neurology

## 2021-07-09 DIAGNOSIS — A419 Sepsis, unspecified organism: Secondary | ICD-10-CM

## 2021-07-09 DIAGNOSIS — G459 Transient cerebral ischemic attack, unspecified: Secondary | ICD-10-CM

## 2021-07-09 DIAGNOSIS — G9341 Metabolic encephalopathy: Secondary | ICD-10-CM

## 2021-07-09 DIAGNOSIS — R531 Weakness: Secondary | ICD-10-CM

## 2021-07-09 DIAGNOSIS — J690 Pneumonitis due to inhalation of food and vomit: Secondary | ICD-10-CM

## 2021-07-09 DIAGNOSIS — K920 Hematemesis: Secondary | ICD-10-CM

## 2021-07-09 LAB — CBC
HCT: 33.5 % — ABNORMAL LOW (ref 39.0–52.0)
Hemoglobin: 11.2 g/dL — ABNORMAL LOW (ref 13.0–17.0)
MCH: 31.6 pg (ref 26.0–34.0)
MCHC: 33.4 g/dL (ref 30.0–36.0)
MCV: 94.6 fL (ref 80.0–100.0)
Platelets: 374 10*3/uL (ref 150–400)
RBC: 3.54 MIL/uL — ABNORMAL LOW (ref 4.22–5.81)
RDW: 12.4 % (ref 11.5–15.5)
WBC: 10.6 10*3/uL — ABNORMAL HIGH (ref 4.0–10.5)
nRBC: 0 % (ref 0.0–0.2)

## 2021-07-09 LAB — BASIC METABOLIC PANEL
Anion gap: 6 (ref 5–15)
BUN: 18 mg/dL (ref 6–20)
CO2: 30 mmol/L (ref 22–32)
Calcium: 8.5 mg/dL — ABNORMAL LOW (ref 8.9–10.3)
Chloride: 102 mmol/L (ref 98–111)
Creatinine, Ser: 0.93 mg/dL (ref 0.61–1.24)
GFR, Estimated: >60 mL/min (ref >60)
Glucose, Bld: 95 mg/dL (ref 70–99)
Potassium: 3.8 mmol/L (ref 3.5–5.1)
Sodium: 138 mmol/L (ref 135–145)

## 2021-07-09 LAB — ECHOCARDIOGRAM COMPLETE
AR max vel: 3.52 cm2
AV Area VTI: 3.02 cm2
AV Area mean vel: 3 cm2
AV Mean grad: 3 mmHg
AV Peak grad: 4.9 mmHg
Ao pk vel: 1.11 m/s
Area-P 1/2: 2.37 cm2
Height: 69 in
MV VTI: 2.42 cm2
S' Lateral: 3.5 cm
Weight: 2017.65 oz

## 2021-07-09 LAB — LIPID PANEL
Cholesterol: 116 mg/dL (ref 0–200)
HDL: 35 mg/dL — ABNORMAL LOW (ref >40)
LDL Cholesterol: 66 mg/dL (ref 0–99)
Total CHOL/HDL Ratio: 3.3 RATIO
Triglycerides: 74 mg/dL (ref <150)
VLDL: 15 mg/dL (ref 0–40)

## 2021-07-09 LAB — HEMOGLOBIN A1C
Hgb A1c MFr Bld: 5.9 % — ABNORMAL HIGH (ref 4.8–5.6)
Mean Plasma Glucose: 123 mg/dL

## 2021-07-09 LAB — LEGIONELLA PNEUMOPHILA SEROGP 1 UR AG: L. pneumophila Serogp 1 Ur Ag: NEGATIVE

## 2021-07-09 LAB — MYCOPLASMA PNEUMONIAE ANTIBODY, IGM: Mycoplasma pneumo IgM: <770 U/mL (ref 0–769)

## 2021-07-09 LAB — PROCALCITONIN: Procalcitonin: 0.4 ng/mL

## 2021-07-09 MED ORDER — KETOROLAC TROMETHAMINE 30 MG/ML IJ SOLN
30.0000 mg | Freq: Once | INTRAMUSCULAR | Status: AC
  Administered 2021-07-09: 30 mg via INTRAVENOUS
  Filled 2021-07-09: qty 1

## 2021-07-09 MED ORDER — ASPIRIN EC 81 MG PO TBEC
81.0000 mg | DELAYED_RELEASE_TABLET | Freq: Every day | ORAL | Status: DC
  Administered 2021-07-09 – 2021-07-11 (×3): 81 mg via ORAL
  Filled 2021-07-09 (×3): qty 1

## 2021-07-09 MED ORDER — ATORVASTATIN CALCIUM 20 MG PO TABS
40.0000 mg | ORAL_TABLET | Freq: Every evening | ORAL | Status: DC
  Administered 2021-07-09 – 2021-07-10 (×2): 40 mg via ORAL
  Filled 2021-07-09 (×2): qty 2

## 2021-07-09 NOTE — Progress Notes (Addendum)
Subjective: Feels back to normal.   Exam: Vitals:   07/09/21 1024 07/09/21 1127  BP: (!) 161/87 (!) 148/83  Pulse: 75 64  Resp: 17 16  Temp:  (!) 97.5 F (36.4 C)  SpO2: 97% 95%   Gen: In bed, NAD Resp: non-labored breathing, no acute distress Abd: soft, nt  Neuro: MS: awake, alert, interactive and appropriate XB:JYNW, VFF, face symmetric Motor: no drift, 5/5 throughout Sensory:intact to LT.   Pertinent Labs: LDL 66 A1c pending   Impression: 55 yo M with transient right sided numbness last night. With negative MRI, this is most consistent with TIA. In the setting of cocaine use, I think that this likely played a role and I have informed him that he is to not use under any circumstances and he expresses understanding. I would typically use antiplatelets, but with recent GI bleed, don't think that is prudent at this time.   Recommendations: 1)LDL < 70, no treatment needed 2) Would typically use antiplatelets, would consider restarting with plavix once cleared by GI.  3) Cocaine cessation 4) Echocardiogram.    Ritta Slot, MD [facility] [phone]  If 7pm- 7am, please page neurology on call as listed in AMION.  GI indicates that antiplatelets can be considered at this point. Will start ASA 81mg  daily.   , MD [facility] [phone]  If 7pm- 7am, please page neurology on call as listed in AMION.

## 2021-07-09 NOTE — Evaluation (Signed)
Occupational Therapy Evaluation Patient Details Name: Bill Villanueva MRN: [id number] DOB: [birth year] Today's Date: 07/09/2021   History of Present Illness Bill Villanueva is a 55 year old male with a past medical history of polysubstance abuse, ETOH abuse, TBI, PTSD, HTN, depression and anxiety, who presented to [facility] ED on 07/07/2021 due to altered mental status. 10/13 Nurse present when pt reported right-sided numbness and weakness and code stroke activated. Neurology consulted for further workup.   Clinical Impression   Patient presenting with decreased Ind in self care, balance, functional mobility/transfers, endurance, and safety awareness.Patient report being independent in all aspects of care and mobility PTA. Pt with significant decrease in sensation on R side and not aware of painful stimuli when tested. Pt displays decreased frustration tolerance with [facility] tasks. Reports of double vision while in hospital but not during evaluation. Pt needing mod +2 to ambulate and transfer short distance with mod multimodal cuing for weight shift and technique. Patient will benefit from acute OT to increase overall independence in the areas of ADLs, functional mobility, and safety awareness in order to safely discharge to next venue of care.      Recommendations for follow up therapy are one component of a multi-disciplinary discharge planning process, led by the attending physician.  Recommendations may be updated based on patient status, additional functional criteria and insurance authorization.   Follow Up Recommendations  CIR    Equipment Recommendations  Other (comment) (defer to next venue of care)       Precautions / Restrictions Precautions Precautions: Fall Restrictions Weight Bearing Restrictions: No      Mobility Bed Mobility Overal bed mobility: Needs Assistance Bed Mobility: Supine to Sit     Supine to sit: Supervision     General bed mobility comments: Requires additional  time and cues to scoot forward to sit EOB    Transfers Overall transfer level: Needs assistance Equipment used: 1 person hand held assist Transfers: Sit to/from Stand Sit to Stand: Min assist;+2 safety/equipment         General transfer comment: Pt with decreased motor planning and needing mod +2 for weight shift, advancement, and R knee blocking.    Balance Overall balance assessment: Needs assistance Sitting-balance support: No upper extremity supported;Feet supported Sitting balance-Leahy Scale: Fair Sitting balance - Comments: Occasional loss of balance towards R side without UE assistance   Standing balance support: Single extremity supported;During functional activity Standing balance-Leahy Scale: Poor Standing balance comment: Requiring 1 HHA to maintain balance.                           ADL either performed or assessed with clinical judgement   ADL                           Toilet Transfer: +2 for physical assistance;Moderate assistance Toilet Transfer Details (indicate cue type and reason): simulated                 Vision Patient Visual Report: Other (comment) Additional Comments: Pt visually scans without issue. He does report diplopia recently but not during this session.            Pertinent Vitals/Pain Pain Assessment: No/denies pain     Hand Dominance     Extremity/Trunk Assessment Upper Extremity Assessment Upper Extremity Assessment: RUE deficits/detail RUE Deficits / Details: 3+/5. Pt does not react to painful stimuli. RUE Sensation: decreased light touch;decreased proprioception RUE  Coordination: decreased fine motor   Lower Extremity Assessment Lower Extremity Assessment: Defer to PT evaluation RLE Deficits / Details: 3+/5 MMT throughout LE RLE Sensation:  (Light touch, pin prick, and proprioception absent throughout R LE) RLE Coordination:  (Decreased speed with toe taps and heel to shin testing)        Communication Communication Communication: No difficulties   Cognition Arousal/Alertness: Awake/alert Behavior During Therapy: WFL for tasks assessed/performed Overall Cognitive Status: Within Functional Limits for tasks assessed                                 General Comments: Pt with decreased frustration tolerance but follows simple commands with increased time.   General Comments  Pt demonstrating full eye ROM B and intact saccades. Pt reports some double vision but improving since yesterday            Home Living Family/patient expects to be discharged to:: Private residence Living Arrangements: Spouse/significant other Available Help at Discharge: Available 24 hours/day;Friend(s) Type of Home: Mobile home Home Access: Stairs to enter Entrance Stairs-Number of Steps: 5 Entrance Stairs-Rails: Left Home Layout: One level     Bathroom Shower/Tub: Chief Strategy Officer: Standard     Home Equipment: None   Additional Comments: Pt reports that he lives with in girlfriend and that he does not drive and uses public transport to get to/from appointments. Pt reports being self-employeed      Prior Functioning/Environment Level of Independence: Independent        Comments: Independent with ADLs, girlfriend assists with cooking and cleaning        OT Problem List: Decreased strength;Decreased activity tolerance;Impaired balance (sitting and/or standing);Decreased coordination;Decreased cognition;Decreased safety awareness;Impaired vision/perception;Decreased knowledge of precautions;Impaired UE functional use;Impaired sensation      OT Treatment/Interventions: Self-care/ADL training;Therapeutic exercise;Therapeutic activities;Energy conservation;DME and/or AE instruction;Patient/family education;Manual therapy;Balance training;Cognitive remediation/compensation    OT Goals(Current goals can be found in the care plan section) Acute Rehab OT  Goals Patient Stated Goal: to walk and get home OT Goal Formulation: With patient Time For Goal Achievement: 07/23/21 Potential to Achieve Goals: Good  OT Frequency: Min 2X/week   Barriers to D/C:    none known at this time       Co-evaluation PT/OT/SLP Co-Evaluation/Treatment: Yes Reason for Co-Treatment: Complexity of the patient's impairments (multi-system involvement);Necessary to address cognition/behavior during functional activity;For patient/therapist safety;To address functional/ADL transfers PT goals addressed during session: Mobility/safety with mobility OT goals addressed during session: ADL's and self-care      AM-PAC OT "6 Clicks" Daily Activity     Outcome Measure Help from another person eating meals?: A Little Help from another person taking care of personal grooming?: A Little Help from another person toileting, which includes using toliet, bedpan, or urinal?: A Lot Help from another person bathing (including washing, rinsing, drying)?: A Lot Help from another person to put on and taking off regular upper body clothing?: A Lot Help from another person to put on and taking off regular lower body clothing?: A Lot 6 Click Score: 14   End of Session Nurse Communication: Mobility status  Activity Tolerance: Patient tolerated treatment well Patient left: Other (comment) (transport chair at RN station)  OT Visit Diagnosis: Unsteadiness on feet (R26.81);Hemiplegia and hemiparesis Hemiplegia - Right/Left: Right Hemiplegia - dominant/non-dominant: Dominant Hemiplegia - caused by: Unspecified  Time: 5625-6389 OT Time Calculation (min): 18 min Charges:  OT General Charges $OT Visit: 1 Visit OT Evaluation $OT Eval Moderate Complexity: 1 [address] [place], OTR/L , CBIS ascom [phone]  07/09/21, 1:36 PM

## 2021-07-09 NOTE — Evaluation (Signed)
Physical Therapy Evaluation Patient Details Name: Bill Villanueva MRN: [id number] DOB: January 03, 1966 Today's Date: 07/09/2021  History of Present Illness  Bill Villanueva is a 55 year old male with a past medical history of polysubstance abuse, ETOH abuse, TBI, PTSD, HTN, depression and anxiety, who presented to [facility] ED on 07/07/2021 due to altered mental status. 10/13 Nurse present when pt reported right-sided numbness and weakness and code stroke activated. Neurology consulted for further workup.   Clinical Impression  Pt is a pleasant 55 year old male who presents to PT/OT co-evaluation after admission for AMS on 10/12 and on 10/13 pt presenting with right sided weakness and numbness. Prior to admission, pt reports being independent with all ADLs and no prior usage of assistive devices. Upon evaluation, pt is mod I with bed mobility, minA + 2 for transfers, and modA +2 for ambulation of 5 ft with 1 HHA and +2 for R knee buckling in stance. Pt demonstrating absent light touch and pinprick sensation in R LE , 3+/5 strength throughout R LE, impaired LE coordination, and absent distal R LE proprioception. Pt demonstrating decreased insight to his deficits at this time. Recommending CIR for patient to maximize his potential to return to prior level and improve safety awareness. Will continue to see patient during acute hospitalization to improve mobility and safety.   Recommendations for follow up therapy are one component of a multi-disciplinary discharge planning process, led by the attending physician.  Recommendations may be updated based on patient status, additional functional criteria and insurance authorization.  Follow Up Recommendations CIR    Equipment Recommendations  Other (comment) (TBD next venue of care)    Recommendations for Other Services Rehab consult     Precautions / Restrictions Precautions Precautions: Fall Restrictions Weight Bearing Restrictions: No      Mobility   Bed Mobility Overal bed mobility: Modified Independent             General bed mobility comments: Requires additional time and cues to scoot forward to sit EOB    Transfers Overall transfer level: Needs assistance Equipment used: 1 person hand held assist Transfers: Sit to/from Stand Sit to Stand: Min assist;+2 safety/equipment         General transfer comment: minA to stand. Pt demonstrating fear to weight shift towards R side due to R knee buckling. +2 for R knee blocking and 1 HHA on L  Ambulation/Gait Ambulation/Gait assistance: Mod assist;+2 physical assistance Gait Distance (Feet): 5 Feet Assistive device: 1 person hand held assist Gait Pattern/deviations: Step-to pattern;Decreased weight shift to right     General Gait Details: R knee buckling in stance intermittently. +2 for R knee buckling and L HHA with heavy reliance on HHA  Stairs            Wheelchair Mobility    Modified Rankin (Stroke Patients Only)       Balance Overall balance assessment: Needs assistance Sitting-balance support: No upper extremity supported;Feet supported Sitting balance-Leahy Scale: Fair     Standing balance support: Single extremity supported;During functional activity Standing balance-Leahy Scale: Poor Standing balance comment: Requiring 1 HHA to maintain balance.                             Pertinent Vitals/Pain Pain Assessment: No/denies pain    Home Living Family/patient expects to be discharged to:: Private residence Living Arrangements: Spouse/significant other Available Help at Discharge: Available PRN/intermittently Type of Home: Mobile home Home Access: Stairs  to enter Entrance Stairs-Rails: Left Entrance Stairs-Number of Steps: 5 Home Layout: One level Home Equipment: None Additional Comments: Pt reports that he lives with in girlfriend and that he does not drive and uses public transport to get to/from appointments. Pt reports being  self-employeed    Prior Function Level of Independence: Independent         Comments: Independent with ADLs, girlfriend assists with cooking and cleaning     Hand Dominance        Extremity/Trunk Assessment        Lower Extremity Assessment Lower Extremity Assessment: RLE deficits/detail RLE Deficits / Details: 3+/5 MMT throughout LE RLE Sensation:  (Light touch, pin prick, and proprioception absent throughout R LE) RLE Coordination:  (Decreased speed with toe taps and heel to shin testing)       Communication   Communication: No difficulties  Cognition Arousal/Alertness: Awake/alert Behavior During Therapy: WFL for tasks assessed/performed (Frustrated with his deficits) Overall Cognitive Status: Within Functional Limits for tasks assessed            General Comments: Pt became frustrated with the difficulties he was noticing during testing. Only disoriented slightly to situation - unsure what brought him into the hospital but was able to report what has happened since admission      General Comments General comments (skin integrity, edema, etc.): Pt demonstrating full eye ROM B and intact saccades. Pt reports some double vision but improving since yesterday    Exercises     Assessment/Plan    PT Assessment Patient needs continued PT services  PT Problem List Decreased strength;Decreased range of motion;Decreased activity tolerance;Decreased balance;Decreased mobility;Decreased coordination;Decreased cognition;Decreased safety awareness;Decreased knowledge of precautions;Impaired sensation;Decreased knowledge of use of DME       PT Treatment Interventions DME instruction;Gait training;Stair training;Functional mobility training;Therapeutic activities;Therapeutic exercise;Balance training;Neuromuscular re-education;Patient/family education;Manual techniques;Modalities    PT Goals (Current goals can be found in the Care Plan section)  Acute Rehab PT  Goals Patient Stated Goal: to walk and get home PT Goal Formulation: With patient Time For Goal Achievement: 07/23/21 Potential to Achieve Goals: Good    Frequency 7X/week   Barriers to discharge        Co-evaluation PT/OT/SLP Co-Evaluation/Treatment: Yes Reason for Co-Treatment: Complexity of the patient's impairments (multi-system involvement);Necessary to address cognition/behavior during functional activity;For patient/therapist safety;To address functional/ADL transfers PT goals addressed during session: Mobility/safety with mobility;Balance OT goals addressed during session: ADL's and self-care       AM-PAC PT "6 Clicks" Mobility  Outcome Measure Help needed turning from your back to your side while in a flat bed without using bedrails?: None Help needed moving from lying on your back to sitting on the side of a flat bed without using bedrails?: None Help needed moving to and from a bed to a chair (including a wheelchair)?: A Lot Help needed standing up from a chair using your arms (e.g., wheelchair or bedside chair)?: A Lot Help needed to walk in hospital room?: A Lot Help needed climbing 3-5 steps with a railing? : Total 6 Click Score: 15    End of Session Equipment Utilized During Treatment: Gait belt Activity Tolerance: Patient tolerated treatment well Patient left: in chair;with nursing/sitter in room (Pt left in transport chair in direct handoff to transport personnel) Nurse Communication: Mobility status PT Visit Diagnosis: Unsteadiness on feet (R26.81);Other abnormalities of gait and mobility (R26.89);Muscle weakness (generalized) (M62.81)    Time: 5176-1607 PT Time Calculation (min) (ACUTE ONLY): 24 min   Charges:  PT Evaluation $PT Eval Moderate Complexity: 1 [address], SPT  Verl Blalock 07/09/2021, 12:48 PM

## 2021-07-09 NOTE — Progress Notes (Signed)
Modified NIHSS done q2h overnight. Pt continues to have R sided numbness and weakness. RLE is weaker than earlier in shift, pt reports his leg is "heavy". Pt still able to hold up against gravity. Pt also c/o R shoulder pain. Pain medication given without relief. Pt remain NPO.

## 2021-07-09 NOTE — Progress Notes (Signed)
Patient ID: Bill Villanueva, male   DOB: May 20, 1966, 55 y.o.   MRN: [id number] [facility] PROGRESS NOTE  Bill Villanueva QHK:[id number] DOB: Apr 22, 1966 DOA: 07/07/2021 PCP: [facility]  HPI/Subjective: Patient seen this morning and was feeling okay.  Still complains of some right-sided weakness.  Patient admitted by the critical care service on 07/07/2021 with acute metabolic encephalopathy requiring Narcan drip and acute hypoxic hypercapnic respiratory failure and sepsis secondary to aspiration pneumonia.  Patient has some cough.  Breathing better.  Objective: Vitals:   07/09/21 1024 07/09/21 1127  BP: (!) 161/87 (!) 148/83  Pulse: 75 64  Resp: 17 16  Temp:  (!) 97.5 F (36.4 C)  SpO2: 97% 95%    Intake/Output Summary (Last 24 hours) at 07/09/2021 1338 Last data filed at 07/09/2021 0227 Gross per 24 hour  Intake 203 ml  Output 2225 ml  Net -2022 ml   Filed Weights   07/07/21 1520 07/07/21 2030  Weight: 61.2 kg 57.2 kg    ROS: Review of Systems  Respiratory:  Positive for cough. Negative for shortness of breath.   Cardiovascular:  Negative for chest pain.  Gastrointestinal:  Negative for abdominal pain, nausea and vomiting.  Neurological:  Positive for sensory change and focal weakness.  Exam: Physical Exam HENT:     Head: Normocephalic.     Mouth/Throat:     Pharynx: No oropharyngeal exudate.  Eyes:     General: Lids are normal.     Conjunctiva/sclera: Conjunctivae normal.  Cardiovascular:     Rate and Rhythm: Normal rate and regular rhythm.     Heart sounds: Normal heart sounds, S1 normal and S2 normal.  Pulmonary:     Breath sounds: No decreased breath sounds, wheezing, rhonchi or rales.  Abdominal:     Palpations: Abdomen is soft.     Tenderness: There is no abdominal tenderness.  Musculoskeletal:     Right lower leg: No swelling.     Left lower leg: No swelling.  Skin:    General: Skin is warm.     Findings: No rash.   Neurological:     Mental Status: He is alert and oriented to person, place, and time.     Comments: Patient able to straight leg raise bilaterally.  Patient states that his right side is a little weaker than the left.      Scheduled Meds:  azithromycin  500 mg Oral Daily   Chlorhexidine Gluconate Cloth  6 each Topical Q0600   folic acid  1 mg Oral Daily   multivitamin with minerals  1 tablet Oral Daily   pantoprazole (PROTONIX) IV  40 mg Intravenous Q12H   sodium chloride flush  3 mL Intravenous Q12H   thiamine injection  100 mg Intravenous Q24H   Continuous Infusions:  sodium chloride     cefTRIAXone (ROCEPHIN)  IV Stopped (07/08/21 2145)    Assessment/Plan:  Right-sided weakness and numbness likely TIA.  MRI of the brain negative.  Holding on antiplatelets with recent vomiting blood.  We will start atorvastatin.  Physical therapy currently recommending CIR.  Hopefully will get stronger while here and be able to go home. Acute metabolic encephalopathy has improved.  Polysubstance abuse with urine toxicology positive for cocaine cannabis and amphetamines. Severe sepsis with acute metabolic encephalopathy, present on admission with aspiration pneumonia, leukocytosis tachycardia and lactic acidosis.  On Rocephin and Zithromax Hematemesis on IV Protonix.  Holding off on antiplatelet agents.  Hemoglobin 11.2 today. Acute hypercarbic respiratory  failure likely secondary to polysubstance abuse.Marland Kitchen  Resolved currently breathing on room air.        Code Status:     Code Status Orders  (From admission, onward)           Start     Ordered   07/07/21 1716  Full code  Continuous        07/07/21 1717           Code Status History     Date Active Date Inactive Code Status Order ID Comments User Context   12/23/2019 0605 12/25/2019 2326 Full Code [id number]  Campbell Lerner, MD ED   11/16/2017 0006 11/20/2017 1608 Full Code [id number]  Audery Amel, MD Inpatient   11/15/2017  1853 11/15/2017 2323 Full Code [id number]  Pershing Proud, Myra Rude, MD ED      Family Communication: Declined Disposition Plan: Status is: Inpatient  Consultants: Was on critical care team Neurology Gastroenterology  Antibiotics: Rocephin and Zithromax  Time spent: 28 minutes  Bill Villanueva [employer]

## 2021-07-09 NOTE — Progress Notes (Signed)
GI Inpatient Follow-up Note  Subjective:  Patient seen in follow-up for hemodynamically stable GI bleed. Yesterday afternoon he developed acute right-sided weakness and code stroke was called. He had CTA head/neck which was negative and MRI brain without contrast today was negative. Neurology following. There have been no further episodes of emesis. No overt gastrointestinal bleeding. He is still having some right-sided weakness but reports it is better than last night. He is tolerating diet.   Scheduled Inpatient Medications:   atorvastatin  40 mg Oral QPM   azithromycin  500 mg Oral Daily   Chlorhexidine Gluconate Cloth  6 each Topical Q0600   folic acid  1 mg Oral Daily   multivitamin with minerals  1 tablet Oral Daily   pantoprazole (PROTONIX) IV  40 mg Intravenous Q12H   sodium chloride flush  3 mL Intravenous Q12H   thiamine injection  100 mg Intravenous Q24H    Continuous Inpatient Infusions:    sodium chloride     cefTRIAXone (ROCEPHIN)  IV Stopped (07/08/21 2145)    PRN Inpatient Medications:  sodium chloride, acetaminophen, docusate sodium, ondansetron (ZOFRAN) IV, polyethylene glycol, sodium chloride flush  Review of Systems: Constitutional: Weight is stable.  Eyes: No changes in vision. ENT: No oral lesions, sore throat.  GI: see HPI.  Heme/Lymph: No easy bruising.  CV: No chest pain.  GU: No hematuria.  Integumentary: No rashes.  Neuro: No headaches.  Psych: No depression/anxiety.  Endocrine: No heat/cold intolerance.  Allergic/Immunologic: No urticaria.  Resp: No cough, SOB.  Musculoskeletal: No joint swelling.    Physical Examination: BP (!) 148/83   Pulse 64   Temp (!) 97.5 F (36.4 C) (Oral)   Resp 16   Ht 5\' 9"  (1.753 m)   Wt 57.2 kg   SpO2 95%   BMI 18.62 kg/m  Gen: NAD, alert and oriented x 4 HEENT: PEERLA, EOMI, Neck: supple, no JVD or thyromegaly Chest: CTA bilaterally, no wheezes, crackles, or other adventitious sounds CV: RRR, no  m/g/c/r Abd: soft, NT, ND, +BS in all four quadrants; no HSM, guarding, ridigity, or rebound tenderness Ext: no edema, well perfused with 2+ pulses, Skin: no rash or lesions noted Lymph: no LAD Neuro: reduced grip strength right hand  Data: Lab Results  Component Value Date   WBC 10.6 (H) 07/09/2021   HGB 11.2 (L) 07/09/2021   HCT 33.5 (L) 07/09/2021   MCV 94.6 07/09/2021   PLT 374 07/09/2021   Recent Labs  Lab 07/08/21 0956 07/08/21 2225 07/09/21 0605  HGB 10.7* 11.4* 11.2*   Lab Results  Component Value Date   NA 138 07/09/2021   K 3.8 07/09/2021   CL 102 07/09/2021   CO2 30 07/09/2021   BUN 18 07/09/2021   CREATININE 0.93 07/09/2021   Lab Results  Component Value Date   ALT 27 07/07/2021   AST 35 07/07/2021   ALKPHOS 104 07/07/2021   BILITOT 0.4 07/07/2021   Recent Labs  Lab 07/07/21 1526  APTT 34  INR 0.9   Assessment/Plan:  55 y/o Caucasian male with a PMH of PTSD, paranoid schizophrenia, Bipolar I disorder, HTN, Hx of TBI, and polysubstance abuse admitted to [facility] yesterday evening for acute metabolic encephalopathy due to polysubstance intoxication (UDS + for amphetamines, cocaine, and cannabinoids) requiring Narcan gtt along with acute hypoxic and hypercapnic respiratory failure and sepsis 2/2 aspiration pneumonia. GI consulted for concerns of acute UGIB    Upper GI Bleed - one episode of frank hematemesis ~250 cc in the  ED 10/12 with no recurrent hematemesis. H&H stable at 11.2 today.   TIA - CTA head/neck negative, MRI brain today negative. Neurology following.    3.   Acute metabolic encephalopathy 2/2 polysubstance intoxication - UDS + for amphetamines, cocaine, and cannabinoids. Mental status improved today. Discussed importance of cessation of illegal substances.    4.   Sepsis 2/2 aspiration pneumonia     5.   Polysubstance abuse   6.   Hyperkalemia - resolved     Recommendations:   - H&H stable with no precipitous drop in hemoglobin or  signs of overt gastrointestinal bleeding - Continue to monitor serial H&H. Transfuse for Hgb <8.0.  - Continue Protonix for acid suppresstion therapy - Monitor for signs and symptoms of gastrointestinal bleeding - No plans for emergent endoscopy at present time given lack of overt GIB, precipitous drop in hemoglobin, and risks of anesthesia with + UDS - If he develops acute GI bleeding, please call Dr. Norma Fredrickson.  - Continue regular diet - Patient is stable to start antiplatelet therapy from a GI standpoint. If he develops signs and/or symptoms of bleeding, would need to hold and consider endoluminal evaluation. Given his acute TIA, antiplatelet therapy is indicated.  - GI will follow peripherally   Please call with questions or concerns.    Jacob Moores, PA-C [facility] [phone] (Cell)

## 2021-07-09 NOTE — Progress Notes (Signed)
SLP Cancellation Note  Patient Details Name: Bill Villanueva MRN: [id number] DOB: Apr 20, 1966   Cancelled treatment:       Reason Eval/Treat Not Completed: SLP screened, no needs identified, will sign off (chart reviewed; consulted NSG and met w/ pt).   Admitting dxs: acute Metabolic Encephalopathy, exact etiology unclear at this time, but suspect due to substance abuse. High risk for development of DT's. History includes: Current use of Polysubstance abuse, ETOH abuse, Bipolar disorder, Paranoid Schizophrenia, TBI, PTSD, Anxiety.  Pt denied any difficulty swallowing and is currently on a regular diet; tolerates swallowing pills w/ water per NSG. Pt recalled his Lunch meal items from the tray on table; noted he ate ~50%. He requested more Soda. Pt conversed in general conversation w/out overt expressive/receptive deficits noted; pt denied any speech-language deficits. Speech clear. He described his morning events; likes and dislikes about certain foods("no fish"). He also explained that his TV remote did not work and requested a new one(unable to reprogram remote/TV so requested Nursing to f/u). He stated several TV shows he likes to watch("but not that one" referring to the Care ch.). Noted appropriate follow through during recent PT session.   No further skilled ST services indicated as pt appears at his baseline. Pt agreed. NSG to reconsult if any change in status while admitted.       Orinda Kenner, MS, CCC-SLP Speech Language Pathologist Rehab Services [phone] [facility] 07/09/2021, 1:39 PM

## 2021-07-09 NOTE — Progress Notes (Signed)
*  PRELIMINARY RESULTS* Echocardiogram 2D Echocardiogram has been performed.  Bill Villanueva 07/09/2021, 9:15 AM

## 2021-07-09 NOTE — Progress Notes (Signed)
Inpatient Rehab Admissions Coordinator:   Per PT recommendations,  patient was screened for CIR candidacy by Megan Salon, MS, CCC-SLP. At this time, Pt. MRI remains pending andI await OT recommendations. I will not place a consult at thsi time but will rescreen once OT note is in and MRI results are in.  Please contact me any with questions.   Megan Salon, MS, CCC-SLP Rehab Admissions Coordinator  [phone] (celll) [phone] (office)

## 2021-07-10 DIAGNOSIS — S022XXA Fracture of nasal bones, initial encounter for closed fracture: Secondary | ICD-10-CM

## 2021-07-10 DIAGNOSIS — F1910 Other psychoactive substance abuse, uncomplicated: Secondary | ICD-10-CM

## 2021-07-10 MED ORDER — LOSARTAN POTASSIUM 25 MG PO TABS
25.0000 mg | ORAL_TABLET | Freq: Every day | ORAL | Status: DC
  Administered 2021-07-10 – 2021-07-11 (×2): 25 mg via ORAL
  Filled 2021-07-10 (×2): qty 1

## 2021-07-10 MED ORDER — CEFDINIR 300 MG PO CAPS
300.0000 mg | ORAL_CAPSULE | Freq: Two times a day (BID) | ORAL | Status: DC
  Administered 2021-07-10 – 2021-07-11 (×2): 300 mg via ORAL
  Filled 2021-07-10 (×4): qty 1

## 2021-07-10 MED ORDER — PANTOPRAZOLE SODIUM 40 MG PO TBEC
40.0000 mg | DELAYED_RELEASE_TABLET | Freq: Two times a day (BID) | ORAL | Status: DC
  Administered 2021-07-10 – 2021-07-11 (×2): 40 mg via ORAL
  Filled 2021-07-10 (×2): qty 1

## 2021-07-10 MED ORDER — NICOTINE 21 MG/24HR TD PT24
21.0000 mg | MEDICATED_PATCH | Freq: Every day | TRANSDERMAL | Status: DC
  Filled 2021-07-10: qty 1

## 2021-07-10 NOTE — Progress Notes (Signed)
Physical Therapy Treatment Patient Details Name: Bill Villanueva MRN: [id number] DOB: [birth year] Today's Date: 07/10/2021   History of Present Illness Bill Villanueva is a 55 year old male with a past medical history of polysubstance abuse, ETOH abuse, TBI, PTSD, HTN, depression and anxiety, who [facility]07/[facility]21 due to altered mental status. 10/13 Nurse present when pt reported right-sided numbness and weakness and code stroke activated. Neurology consulted for further workup.    PT Comments    Pt was supine in bed reporting feeling better than he did on earlier attempt. He is A and O x 4. On rm air during session with sao2 >92%. Pt reports no symptoms at rest. BP 172/98. Upon sitting up EOB with supervision, pt reports feeling "ok" with BP 168/98. Sat EOB x several minutes prior to standing with RUE HHA +1. Pt quickly reports dizziness and becomes somewhat non verbal for a few sec. Returned to sitting and then quickly to supine. BP once able to safely check, 139/96. Pt continued to feel poorly until symptoms somewhat resolving. BP at conclusion of session 159/94. RN was in room. Session greatly limited by dizziness upon standing. Acute PT will continue to follow. If unable to DC to CIR, recommend SNF to maximize PT while assisting pt to PLOF. Acute PT will return tomorrow and continue to progress pt as able per current POC.   Recommendations for follow up therapy are one component of a multi-disciplinary discharge planning process, led by the attending physician.  Recommendations may be updated based on patient status, additional functional criteria and insurance authorization.  Follow Up Recommendations  CIR/ SNF     Equipment Recommendations  Other (comment) (defer to next level of care)       Precautions / Restrictions Precautions Precautions: Fall Restrictions Weight Bearing Restrictions: No     Mobility  Bed Mobility Overal bed mobility: Needs Assistance Bed  Mobility: Supine to Sit;Sit to Supine     Supine to sit: Supervision Sit to supine: Supervision   General bed mobility comments: Pt was able to progress form supine to short sit with increased time and vcs for improved technique. does struggle from side lying to short sit.    Transfers Overall transfer level: Needs assistance Equipment used: 1 person hand held assist Transfers: Sit to/from Stand Sit to Stand: Min assist;Mod assist         General transfer comment: Pt stood 1 x EOB however quickly reports severe dizziness. pt becomes quite and somewhat non verbal. Returned to sitting and pt quickly returned to supine.  Ambulation/Gait      General Gait Details: unsafe to advance to this due to dizziness/orthostatic hypotension concerns    Balance Overall balance assessment: Needs assistance Sitting-balance support: No upper extremity supported;Feet supported Sitting balance-Leahy Scale: Fair Sitting balance - Comments: Occasional loss of balance towards R side without UE assistance   Standing balance support: Single extremity supported;During functional activity Standing balance-Leahy Scale: Poor Standing balance comment: limited assessment due to dizziness with standing      Cognition Arousal/Alertness: Awake/alert Behavior During Therapy: WFL for tasks assessed/performed Overall Cognitive Status: Within Functional Limits for tasks assessed      General Comments: Pt is alert and oriented. cooperative and motivated. severely limited this date due to orthostatic hypotension             Pertinent Vitals/Pain Pain Assessment: No/denies pain     PT Goals (current goals can now be found in the care plan section) Acute  Rehab PT Goals Patient Stated Goal: return to the way I was Progress towards PT goals: Progressing toward goals    Frequency    7X/week      PT Plan Current plan remains appropriate    Co-evaluation     PT goals addressed during session:  Mobility/safety with mobility;Balance;Proper use of DME;Strengthening/ROM        AM-PAC PT "6 Clicks" Mobility   Outcome Measure  Help needed turning from your back to your side while in a flat bed without using bedrails?: None Help needed moving from lying on your back to sitting on the side of a flat bed without using bedrails?: None Help needed moving to and from a bed to a chair (including a wheelchair)?: A Lot Help needed standing up from a chair using your arms (e.g., wheelchair or bedside chair)?: A Lot Help needed to walk in hospital room?: A Lot Help needed climbing 3-5 steps with a railing? : A Lot 6 Click Score: 16    End of Session Equipment Utilized During Treatment: Gait belt Activity Tolerance: Other (comment);Treatment limited secondary to medical complications (Comment) (othostatic hypotension) Patient left: in bed;with call bell/phone within reach;with bed alarm set;with nursing/sitter in room Nurse Communication: Mobility status PT Visit Diagnosis: Unsteadiness on feet (R26.81);Other abnormalities of gait and mobility (R26.89);Muscle weakness (generalized) (M62.81)     Time: 4268-3419 PT Time Calculation (min) (ACUTE ONLY): 15 min  Charges:  $Therapeutic Activity: 8-22 mins                     Jetta Lout PTA 07/10/21, 11:42 AM

## 2021-07-10 NOTE — Progress Notes (Signed)
PT Cancellation Note  Patient Details Name: Bill Villanueva MRN: [id number] DOB: 1966/02/25   Cancelled Treatment:     PT attempt. Pt is A and O x 4 and agreeable to session. Pt's vitals taken by RN just prior to session. Progressed from supine to > short sit with min assist however has severe dizziness and nausea. Emesis bag issued and pt returned to side lying. Will return shortly to attempt OOB activity again. Pt has not had breakfast.    Rushie Chestnut 07/10/2021, 8:36 AM

## 2021-07-10 NOTE — Progress Notes (Signed)
Patient ID: Bill Villanueva, male   DOB: 09/14/1966, 55 y.o.   MRN: [id number] [facility] PROGRESS NOTE  Bill Villanueva XTG:[id number] DOB: Feb 18, 1966 DOA: 07/07/2021 [facility]: [facility]  HPI/Subjective: Patient still complaining of right-sided weakness and numbness.  Able to straight leg raise but weak with the right side.  With working with physical therapy this morning did get nauseous just with sitting up.  Initially admitted by critical care service for acute metabolic encephalopathy requiring Narcan drip and acute hypoxic hypercapnic respiratory failure and sepsis secondary to aspiration pneumonia  Objective: Vitals:   07/10/21 0441 07/10/21 0800  BP: (!) 145/99 139/89  Pulse: 74   Resp: 16 11  Temp: 97.9 F (36.6 C) 98.1 F (36.7 C)  SpO2: 97% 100%    Intake/Output Summary (Last 24 hours) at 07/10/2021 1448 Last data filed at 07/10/2021 0036 Gross per 24 hour  Intake 240 ml  Output 800 ml  Net -560 ml    Filed Weights   07/07/21 1520 07/07/21 2030 07/10/21 0500  Weight: 61.2 kg 57.2 kg 54.1 kg    ROS: Review of Systems  Respiratory:  Negative for shortness of breath.   Cardiovascular:  Negative for chest pain.  Gastrointestinal:  Positive for nausea. Negative for abdominal pain.  Neurological:  Positive for sensory change and focal weakness.  Exam: Physical Exam HENT:     Head: Normocephalic.     Mouth/Throat:     Pharynx: No oropharyngeal exudate.  Eyes:     General: Lids are normal.     Conjunctiva/sclera: Conjunctivae normal.  Cardiovascular:     Rate and Rhythm: Normal rate and regular rhythm.     Heart sounds: Normal heart sounds, S1 normal and S2 normal.  Pulmonary:     Breath sounds: No decreased breath sounds, wheezing, rhonchi or rales.  Abdominal:     Palpations: Abdomen is soft.     Tenderness: There is no abdominal tenderness.  Musculoskeletal:     Right lower leg: No swelling.     Left lower leg: No  swelling.  Skin:    General: Skin is warm.     Findings: No rash.  Neurological:     Mental Status: He is alert and oriented to person, place, and time.     Comments: Will more difficulty with straight leg raise right leg.  No problem straight leg raise left leg.      Scheduled Meds:  aspirin EC  81 mg Oral Daily   atorvastatin  40 mg Oral QPM   azithromycin  500 mg Oral Daily   folic acid  1 mg Oral Daily   losartan  25 mg Oral Daily   multivitamin with minerals  1 tablet Oral Daily   pantoprazole (PROTONIX) IV  40 mg Intravenous Q12H   sodium chloride flush  3 mL Intravenous Q12H   thiamine injection  100 mg Intravenous Q24H   Continuous Infusions:  sodium chloride     cefTRIAXone (ROCEPHIN)  IV 2 g (07/09/21 2232)    Assessment/Plan:  Right-sided weakness and numbness persisting.  Could be a stroke but not seen on MRI but likely a TIA.  MRI of the brain negative for acute stroke.  Aspirin started last night.  Started on Lipitor.  Continue working with physical therapy.  Acute metabolic encephalopathy.  This has improved and is oriented at this point.  Likely secondary to polysubstance abuse.  Urine toxicology positive for cocaine, cannabis and amphetamines. Severe sepsis, present on  admission with acute metabolic encephalopathy, aspiration pneumonia, leukocytosis tachycardia and lactic acidosis.  Switch Rocephin over to [facility].  Continue Zithromax. 1 episode of hematemesis with nausea today.  Switch IV Protonix over to oral.  Watch hemoglobin with starting aspirin. Acute hypercarbic respiratory failure secondary to polysubstance abuse.  This has resolved and breathing comfortably on room air. Nasal bone fracture seen on CT scan on admission.     Code Status:     Code Status Orders  (From admission, onward)           Start     Ordered   07/07/21 1716  Full code  Continuous        07/07/21 1717           Code Status History     Date Active Date Inactive  Code Status Order ID Comments User Context   12/23/2019 0605 12/25/2019 2326 Full Code [id number]  Campbell Lerner, MD ED   11/16/2017 0006 11/20/2017 1608 Full Code [id number]  Audery Amel, MD Inpatient   11/15/2017 1853 11/15/2017 2323 Full Code [id number]  Pershing Proud, Myra Rude, MD ED      Family Communication: Declined Disposition Plan: Status is: Inpatient  Consultants: Neurology Gastroenterology  Antibiotics: Truman Hayward and Zithromax  Time spent: 27 minutes, case discussed with PT and neurology  Bill Villanueva  [facility]

## 2021-07-10 NOTE — Progress Notes (Signed)
Echo reviewed. No indication for anticoagulation. He has been started on aspirin after discussion with GI.   No further recommendations at this time, please call with further questions or concerns.   Ritta Slot, MD [facility] [phone]  If 7pm- 7am, please page neurology on call as listed in AMION.

## 2021-07-10 NOTE — Progress Notes (Signed)
Pt verbalized RN could speak with sister Drucie Opitz and update her on how he is doing. Pt sister concerned about his frustration with girlfriend earlier in shift. Sister would like for pt to have a psych consult to restart psych meds. Informed sister that pt would need to be agreeable to treatment and would pass on to attending.

## 2021-07-10 NOTE — NC FL2 (Signed)
[place] MEDICAID FL2 LEVEL OF CARE SCREENING TOOL     IDENTIFICATION  Patient Name: Bill Villanueva Birthdate: 03-09-1966 Sex: male Admission Date (Current Location): 07/07/2021  [facility] and [place] Number:  Chiropodist and Address:  [facility], [address], [place], [place] [address]      Provider Number: [id number]  Attending Physician Name and Address:  Alford Highland, MD  Relative Name and Phone Number:       Current Level of Care: Hospital Recommended Level of Care: Skilled Nursing Facility Prior Approval Number:    Date Approved/Denied:   PASRR Number:    Discharge Plan:      Current Diagnoses: Patient Active Problem List   Diagnosis Date Noted   Polysubstance abuse [facility])    Closed fracture of nasal bones    Right sided weakness    Acute metabolic encephalopathy    Severe sepsis (HCC)    Aspiration pneumonia due to gastric secretions (HCC)    Hematemesis with nausea    Encephalopathy 07/07/2021   Splenic laceration 12/23/2019   Esophageal obstruction due to food impaction    Tobacco use disorder 11/16/2017   Cocaine use disorder, moderate, dependence (HCC) 11/15/2017   Alcohol abuse 11/15/2017   Cannabis use disorder, moderate, dependence (HCC) 11/15/2017   PTSD (post-traumatic stress disorder) 11/15/2017    Orientation RESPIRATION BLADDER Height & Weight     Self, Time, Situation, Place  Normal Continent Weight: 119 lb 4.3 oz (54.1 kg) Height:  5\' 9"  (175.3 cm)  BEHAVIORAL SYMPTOMS/MOOD NEUROLOGICAL BOWEL NUTRITION STATUS      Continent Diet (regular)  AMBULATORY STATUS COMMUNICATION OF NEEDS Skin   Supervision Verbally Normal                       Personal Care Assistance Level of Assistance  Bathing, Feeding, Dressing Bathing Assistance: Limited assistance Feeding assistance: Independent Dressing Assistance: Limited assistance     Functional Limitations Info             SPECIAL CARE  FACTORS FREQUENCY  PT (By licensed PT), OT (By licensed OT)     PT Frequency: 5 times per week OT Frequency: 5 times per week            Contractures      Additional Factors Info  Code Status, Allergies Code Status Info: full Allergies Info: nka           Current Medications (07/10/2021):  This is the current hospital active medication list Current Facility-Administered Medications  Medication Dose Route Frequency Provider Last Rate Last Admin   0.9 %  sodium chloride infusion  250 mL Intravenous PRN 07/12/2021, MD       acetaminophen (TYLENOL) tablet 650 mg  650 mg Oral Q4H PRN Erin Fulling, MD   650 mg at 07/09/21 2228   aspirin EC tablet 81 mg  81 mg Oral Daily 2229, MD   81 mg at 07/10/21 1059   atorvastatin (LIPITOR) tablet 40 mg  40 mg Oral QPM 07/12/21, MD   40 mg at 07/09/21 1805   azithromycin (ZITHROMAX) tablet 500 mg  500 mg Oral Daily 07/11/21, RPH   500 mg at 07/09/21 1805   cefdinir (OMNICEF) capsule 300 mg  300 mg Oral Q12H Wieting, Richard, MD       docusate sodium (COLACE) capsule 100 mg  100 mg Oral BID PRN 07/11/21, MD  folic acid (FOLVITE) tablet 1 mg  1 mg Oral Daily Harlon Ditty D, NP   1 mg at 07/10/21 1059   losartan (COZAAR) tablet 25 mg  25 mg Oral Daily Alford Highland, MD   25 mg at 07/10/21 1059   multivitamin with minerals tablet 1 tablet  1 tablet Oral Daily Harlon Ditty D, NP   1 tablet at 07/10/21 1058   ondansetron (ZOFRAN) injection 4 mg  4 mg Intravenous Q6H PRN Erin Fulling, MD       pantoprazole (PROTONIX) EC tablet 40 mg  40 mg Oral BID Wieting, Richard, MD       polyethylene glycol (MIRALAX / GLYCOLAX) packet 17 g  17 g Oral Daily PRN Erin Fulling, MD       sodium chloride flush (NS) 0.9 % injection 3 mL  3 mL Intravenous Q12H Kasa, Wallis Bamberg, MD   3 mL at 07/10/21 1058   sodium chloride flush (NS) 0.9 % injection 3 mL  3 mL Intravenous PRN Erin Fulling, MD   3 mL at 07/08/21 0942    thiamine (B-1) injection 100 mg  100 mg Intravenous Q24H Harlon Ditty D, NP   100 mg at 07/09/21 1805     Discharge Medications: Please see discharge summary for a list of discharge medications.  Relevant Imaging Results:  Relevant Lab Results:   Additional Information SS #: [id number]  Brodie Scovell E Shondra Capps, LCSW

## 2021-07-10 NOTE — TOC Initial Note (Signed)
Transition of Care [facility]) - Initial/Assessment Note    Patient Details  Name: Bill Villanueva MRN: [id number] Date of Birth: May 03, 1966  Transition of Care [facility]) CM/SW Contact:    Liliana Cline, LCSW Phone Number: 07/10/2021, 3:32 PM  Clinical Narrative:       Spoke to patient. CIR has declined him. Explained options of HH or SNF. Patient prefers HH, but says he is agreeable to SNF if [facility] coverage cannot be established. CSW reached out to [facility], [facility], [place], [place], [facility], [place], [place], and [facility] to see if they an accept patient for [facility] services. Waiting for responses.  Starting SNF workup as backup.  Patient lives with his significant other. Uses [employer] transportation. Patient says he went to [facility] but needs a new PCP, encouraged him to reach out to DSS/Medicaid. Pharmacy is [facility]. No DME, HH, or SNF history.  Patient confirmed home address as listed.            Expected Discharge Plan: Home w Home Health Services Barriers to Discharge: Continued Medical Work up   Patient Goals and CMS Choice Patient states their goals for this hospitalization and ongoing recovery are:: prefers HH, ok with SNF if HH cannot be established. CMS Medicare.gov Compare Post Acute Care list provided to:: Patient Choice offered to / list presented to : Patient  Expected Discharge Plan and Services Expected Discharge Plan: Home w Home Health Services       Living arrangements for the past 2 months: Single Family Home                                      Prior Living Arrangements/Services Living arrangements for the past 2 months: Single Family Home Lives with:: Significant Other Patient language and need for interpreter reviewed:: Yes Do you feel safe going back to the place where you live?: Yes      Need for Family Participation in Patient Care: Yes (Comment) Care giver support system in place?: Yes (comment)   Criminal Activity/Legal  Involvement Pertinent to Current Situation/Hospitalization: No - Comment as needed  Activities of Daily Living Home Assistive Devices/Equipment: None ADL Screening (condition at time of admission) Patient's cognitive ability adequate to safely complete daily activities?: Yes Is the patient deaf or have difficulty hearing?: No Does the patient have difficulty seeing, even when wearing glasses/contacts?: No Does the patient have difficulty concentrating, remembering, or making decisions?: No Patient able to express need for assistance with ADLs?: Yes Does the patient have difficulty dressing or bathing?: No Independently performs ADLs?: Yes (appropriate for developmental age) Does the patient have difficulty walking or climbing stairs?: No Weakness of Legs: None Weakness of Arms/Hands: None  Permission Sought/Granted Permission sought to share information with : Oceanographer granted to share information with : Yes, Verbal Permission Granted     Permission granted to share info w AGENCY: HH, SNF, DME agencies        Emotional Assessment       Orientation: : Oriented to Self, Oriented to Place, Oriented to  Time, Oriented to Situation Alcohol / Substance Use: Not Applicable Psych Involvement: No (comment)  Admission diagnosis:  Respiratory acidosis [E87.29] Encephalopathy [G93.40] Severe sepsis (HCC) [A41.9, R65.20] Community acquired pneumonia, unspecified laterality [J18.9] Patient Active Problem List   Diagnosis Date Noted   Polysubstance abuse (HCC)    Closed fracture of nasal bones  Right sided weakness    Acute metabolic encephalopathy    Severe sepsis (HCC)    Aspiration pneumonia due to gastric secretions (HCC)    Hematemesis with nausea    Encephalopathy 07/07/2021   Splenic laceration 12/23/2019   Esophageal obstruction due to food impaction    Tobacco use disorder 11/16/2017   Cocaine use disorder, moderate, dependence (HCC)  11/15/2017   Alcohol abuse 11/15/2017   Cannabis use disorder, moderate, dependence (HCC) 11/15/2017   PTSD (post-traumatic stress disorder) 11/15/2017   PCP:  [facility]:   [facility] [address], [address] [place] [address] Phone: [phone] Fax: [phone]  [id number] - [place], [place] - [place] [address] AT [facility] OF [address] [place] [address] Phone: [phone] Fax: [phone]  [facility][id number] [place], [place] - [address] AT [facility] OF [address] [place] [address] Phone: [phone] Fax: [phone]  [facility] - [place], [place] - [address] [place] [address] Phone: [phone] Fax: [phone]     Social Determinants of Health (SDOH) Interventions    Readmission Risk Interventions Readmission Risk Prevention Plan 07/10/2021  Post Dischage Appt Complete  Medication Screening Complete  Transportation Screening Complete  Some recent data might be hidden

## 2021-07-10 NOTE — TOC CM/SW Note (Signed)
TOC continues to follow for CIR screening.  Bill Villanueva, [place] [phone]

## 2021-07-10 NOTE — Progress Notes (Signed)
Inpatient Rehab Admissions Coordinator:   Per therapy recommendations, patient was screened for CIR candidacy by Megan Salon, MS, CCC-SLP. Rehab MD also reviewed this case.  At this time, Pt. does not appear to demonstrate medical necessity to justify in hospital rehabilitation/CIR. I will not pursue a rehab consult for this Pt.   Recommend other rehab venues to be pursued.  Please contact me with any questions.  Megan Salon, MS, CCC-SLP Rehab Admissions Coordinator  [phone] (celll) [phone] (office)

## 2021-07-11 DIAGNOSIS — G459 Transient cerebral ischemic attack, unspecified: Secondary | ICD-10-CM

## 2021-07-11 LAB — BASIC METABOLIC PANEL
Anion gap: 10 (ref 5–15)
BUN: 24 mg/dL — ABNORMAL HIGH (ref 6–20)
CO2: 24 mmol/L (ref 22–32)
Calcium: 9.4 mg/dL (ref 8.9–10.3)
Chloride: 105 mmol/L (ref 98–111)
Creatinine, Ser: 1.01 mg/dL (ref 0.61–1.24)
GFR, Estimated: >60 mL/min (ref >60)
Glucose, Bld: 141 mg/dL — ABNORMAL HIGH (ref 70–99)
Potassium: 4.7 mmol/L (ref 3.5–5.1)
Sodium: 139 mmol/L (ref 135–145)

## 2021-07-11 LAB — CBC
HCT: 43 % (ref 39.0–52.0)
Hemoglobin: 14 g/dL (ref 13.0–17.0)
MCH: 31.4 pg (ref 26.0–34.0)
MCHC: 32.6 g/dL (ref 30.0–36.0)
MCV: 96.4 fL (ref 80.0–100.0)
Platelets: 328 10*3/uL (ref 150–400)
RBC: 4.46 MIL/uL (ref 4.22–5.81)
RDW: 12.3 % (ref 11.5–15.5)
WBC: 8.9 10*3/uL (ref 4.0–10.5)
nRBC: 0 % (ref 0.0–0.2)

## 2021-07-11 MED ORDER — OMEPRAZOLE 40 MG PO CPDR: 40.0000 mg | DELAYED_RELEASE_CAPSULE | Freq: Every day | ORAL | 0 refills | Status: AC

## 2021-07-11 MED ORDER — ASPIRIN 81 MG PO TBEC: 81.0000 mg | DELAYED_RELEASE_TABLET | Freq: Every day | ORAL | 1 refills | Status: AC

## 2021-07-11 MED ORDER — FOLIC ACID 1 MG PO TABS: 1.0000 mg | ORAL_TABLET | Freq: Every day | ORAL | 0 refills | Status: AC

## 2021-07-11 MED ORDER — NICOTINE 21 MG/24HR TD PT24: MEDICATED_PATCH | TRANSDERMAL | 0 refills | Status: AC

## 2021-07-11 MED ORDER — ATORVASTATIN CALCIUM 40 MG PO TABS: 40.0000 mg | ORAL_TABLET | Freq: Every evening | ORAL | 0 refills | Status: AC

## 2021-07-11 MED ORDER — THIAMINE HCL 100 MG PO TABS: 100.0000 mg | ORAL_TABLET | Freq: Every day | ORAL | 0 refills | Status: AC

## 2021-07-11 MED ORDER — AZITHROMYCIN 250 MG PO TABS
250.0000 mg | ORAL_TABLET | Freq: Once | ORAL | Status: AC
  Administered 2021-07-11: 250 mg via ORAL
  Filled 2021-07-11: qty 1

## 2021-07-11 MED ORDER — LOSARTAN POTASSIUM 25 MG PO TABS: 25.0000 mg | ORAL_TABLET | Freq: Every day | ORAL | 0 refills | Status: AC

## 2021-07-11 MED ORDER — CEFDINIR 300 MG PO CAPS: 300.0000 mg | ORAL_CAPSULE | Freq: Two times a day (BID) | ORAL | 0 refills | Status: AC

## 2021-07-11 NOTE — Progress Notes (Addendum)
Physical Therapy Treatment Patient Details Name: Bill Villanueva MRN: [id number] DOB: [birth year] Today's Date: 07/11/2021   History of Present Illness Bill Villanueva is a 55 year old male with a past medical history of polysubstance abuse, ETOH abuse, TBI, PTSD, HTN, depression and anxiety, who presented to [facility] ED on 07/07/2021 due to altered mental status. 10/13 Nurse present when pt reported right-sided numbness and weakness and code stroke activated. Neurology consulted for further workup.    PT Comments    Stood and walked around bed with very unsteady gait and min a x 1 holding onto bed and counter.  Initially declined RW but agreed to try.  On first attempt he is able to walk 107' with step to gait and picking walker up each steps.  Participated in exercises as described below.  He then is able to walk 150' with good step though gait around small nursing pod with +1 assist for safety and increasing confidence.  Pt has made significant progress in overall mobility this date.  Discussed discharge plan.  Pt stated his girlfriend can stay home with him and provide +1 assist initially upon discharge.  He stated he was not interested in CIR at this time due to improving mobility.  Agree with pt that if he has +1 assist for general safety discharge home is reasonable.  Stressed safety precautions and to use RW at all times.   Voiced understanding.  Will discuss with MD.   Recommendations for follow up therapy are one component of a multi-disciplinary discharge planning process, led by the attending physician.  Recommendations may be updated based on patient status, additional functional criteria and insurance authorization.  Follow Up Recommendations  Supervision for mobility/OOB;Home health PT     Equipment Recommendations  Rolling walker with 5" wheels    Recommendations for Other Services       Precautions / Restrictions Precautions Precautions: Fall Restrictions Weight Bearing  Restrictions: No     Mobility  Bed Mobility Overal bed mobility: Modified Independent                  Transfers Overall transfer level: Needs assistance Equipment used: Rolling walker (2 wheeled) Transfers: Sit to/from Stand Sit to Stand: Min guard            Ambulation/Gait Ambulation/Gait assistance: Min guard Gait Distance (Feet): 150 Feet Assistive device: Rolling walker (2 wheeled) Gait Pattern/deviations: Step-through pattern Gait velocity: decreased   General Gait Details: 53' then 160'   Stairs             Wheelchair Mobility    Modified Rankin (Stroke Patients Only)       Balance Overall balance assessment: Needs assistance Sitting-balance support: No upper extremity supported;Feet supported Sitting balance-Leahy Scale: Good     Standing balance support: Bilateral upper extremity supported Standing balance-Leahy Scale: Fair                              Cognition Arousal/Alertness: Awake/alert Behavior During Therapy: WFL for tasks assessed/performed Overall Cognitive Status: Within Functional Limits for tasks assessed                                 General Comments: somewhat impulsive but extensive education given and overall improvement by end of session      Exercises Other Exercises Other Exercises: standing with walker - squats, marches, SLR, heel raises  adn 10x sit to stand with emphasis on hand placements    General Comments        Pertinent Vitals/Pain Pain Assessment: No/denies pain    Home Living                      Prior Function            PT Goals (current goals can now be found in the care plan section) Progress towards PT goals: Progressing toward goals    Frequency    7X/week      PT Plan Discharge plan needs to be updated    Co-evaluation              AM-PAC PT "6 Clicks" Mobility   Outcome Measure  Help needed turning from your back to your  side while in a flat bed without using bedrails?: None Help needed moving from lying on your back to sitting on the side of a flat bed without using bedrails?: None Help needed moving to and from a bed to a chair (including a wheelchair)?: A Little Help needed standing up from a chair using your arms (e.g., wheelchair or bedside chair)?: A Little Help needed to walk in hospital room?: A Little Help needed climbing 3-5 steps with a railing? : A Little 6 Click Score: 20    End of Session Equipment Utilized During Treatment: Gait belt Activity Tolerance: Patient tolerated treatment well Patient left: in bed;with call bell/phone within reach;with bed alarm set Nurse Communication: Mobility status PT Visit Diagnosis: Unsteadiness on feet (R26.81);Other abnormalities of gait and mobility (R26.89);Muscle weakness (generalized) (M62.81)     Time: 6789-3810 PT Time Calculation (min) (ACUTE ONLY): 19 min  Charges:  $Gait Training: 8-22 mins                    Danielle Dess, PTA 07/11/21, 10:56 AM

## 2021-07-11 NOTE — Discharge Summary (Signed)
[facility] [place] at [facility]   PATIENT NAME: Bill Villanueva    MR#:  [id number]  DATE OF BIRTH:  [birth year]  DATE OF ADMISSION:  07/07/2021 ADMITTING PHYSICIAN: Alford Highland, MD  DATE OF DISCHARGE: 07/11/2021  PRIMARY CARE PHYSICIAN: [facility]    ADMISSION DIAGNOSIS:  Respiratory acidosis [E87.29] Encephalopathy [G93.40] Severe sepsis (HCC) [A41.9, R65.20] Community acquired pneumonia, unspecified laterality [J18.9]  DISCHARGE DIAGNOSIS:  Active Problems:   Encephalopathy   Right sided weakness   Acute metabolic encephalopathy   Severe sepsis (HCC)   Aspiration pneumonia due to gastric secretions (HCC)   Hematemesis with nausea   Polysubstance abuse (HCC)   Closed fracture of nasal bones   SECONDARY DIAGNOSIS:   Past Medical History:  Diagnosis Date  . Anxiety   . Bipolar 1 disorder (HCC)   . Depression   . Hypertension   . Paranoid schizophrenia (HCC)   . PTSD (post-traumatic stress disorder)   . TBI (traumatic brain injury)     HOSPITAL COURSE:   1.  Suspected TIA even though MRI of the brain was negative.  Right-sided weakness and numbness.  Treat with atorvastatin and aspirin.  Initially physical therapy recommended CIR but patient did better and did not want to go out to rehab and will go to outpatient physical therapy.  Rolling walker. 2.  Acute metabolic encephalopathy.  This has improved from admission.  Likely secondary to polysubstance abuse.  Initially had to be on Narcan drip.  Urine toxicology positive for cocaine, cannabis and amphetamines. 3.  Severe sepsis, present on admission with acute metabolic encephalopathy, aspiration pneumonia, leukocytosis tachycardia and lactic acidosis.  Patient was on Rocephin and Zithromax.  Patient finished up Zithromax course here in the hospital.  Rocephin switched over to [facility] for few more doses upon disposition. 4.  Hematemesis 1 episode with nausea.   Gastroenterology did not want to do a procedure.  Switched IV Protonix over to oral.  Hemoglobin stable even with starting aspirin.  Hemoglobin upon discharge up at 14. 5.  Essential hypertension and cardiomyopathy with an EF of 45 to 50%.  Low-dose losartan ordered. 6.  Acute hypercarbic respiratory failure secondary to polysubstance abuse.  This is resolved and breathing comfortably on room air. 7.  Nasal bone fracture seen on CT scan on admission  DISCHARGE CONDITIONS:   Satisfactory  CONSULTS OBTAINED:  Neurology Gastroenterology Initially patient was on critical care service  DRUG ALLERGIES:  No Known Allergies  DISCHARGE MEDICATIONS:   Allergies as of 07/11/2021   No Known Allergies      Medication List     TAKE these medications    aspirin 81 MG EC tablet Take 1 tablet (81 mg total) by mouth daily. Swallow whole. Start taking on: July 12, 2021   atorvastatin 40 MG tablet Commonly known as: LIPITOR Take 1 tablet (40 mg total) by mouth every evening.   cefdinir 300 MG capsule Commonly known as: OMNICEF Take 1 capsule (300 mg total) by mouth every 12 (twelve) hours for 3 doses.   folic acid 1 MG tablet Commonly known as: FOLVITE Take 1 tablet (1 mg total) by mouth daily. Start taking on: July 12, 2021   losartan 25 MG tablet Commonly known as: COZAAR Take 1 tablet (25 mg total) by mouth daily. Start taking on: July 12, 2021   nicotine 21 mg/24hr patch Commonly known as: NICODERM CQ - dosed in mg/24 hours One 21mg  patch chest wall daily (okay to substitute  generic)   omeprazole 40 MG capsule Commonly known as: PRILOSEC Take 1 capsule (40 mg total) by mouth daily. What changed: when to take this   thiamine 100 MG tablet Take 1 tablet (100 mg total) by mouth daily.               Durable Medical Equipment  (From admission, onward)           Start     Ordered   07/11/21 1106  For home use only DME Walker rolling  Once        Question Answer Comment  Walker: With 5 Inch Wheels   Patient needs a walker to treat with the following condition Unsteady gait when walking      07/11/21 1106             DISCHARGE INSTRUCTIONS:   Follow-up Dr. On your Medicaid card Follow-up [facility]  If you experience worsening of your admission symptoms, develop shortness of breath, life threatening emergency, suicidal or homicidal thoughts you must seek medical attention immediately by calling 911 or calling your MD immediately  if symptoms less severe.  You Must read complete instructions/literature along with all the possible adverse reactions/side effects for all the Medicines you take and that have been prescribed to you. Take any new Medicines after you have completely understood and accept all the possible adverse reactions/side effects.   Please note  You were cared for by a hospitalist during your hospital stay. If you have any questions about your discharge medications or the care you received while you were in the hospital after you are discharged, you can call the unit and asked to speak with the hospitalist on call if the hospitalist that took care of you is not available. Once you are discharged, your primary care physician will handle any further medical issues. Please note that NO REFILLS for any discharge medications will be authorized once you are discharged, as it is imperative that you return to your primary care physician (or establish a relationship with a primary care physician if you do not have one) for your aftercare needs so that they can reassess your need for medications and monitor your lab values.    Today   CHIEF COMPLAINT:   Chief Complaint  Patient presents with  . Emesis    Pt's friends called EMS for patient acting abnormally with lethargy, dizziness, and weakness. Pt vomiting blood. Per EMS pt had decreased respirations and pinpoint pupils on arrival, given narcan  en route.     HISTORY OF PRESENT ILLNESS:  Bill Villanueva  is a 55 y.o. male patient brought in with altered mental status   VITAL SIGNS:  Blood pressure 124/89, pulse 85, temperature 98.3 F (36.8 C), temperature source Oral, resp. rate 12, height 5\' 9"  (1.753 m), weight 52.6 kg, SpO2 100 %.  I/O:  No intake or output data in the 24 hours ending 07/11/21 1549  PHYSICAL EXAMINATION:  GENERAL:  55 y.o.-year-old patient lying in the bed with no acute distress.  EYES: Pupils equal, round, reactive to light and accommodation. No scleral icterus.  HEENT: Head atraumatic, normocephalic. Oropharynx and nasopharynx clear.   LUNGS: Normal breath sounds bilaterally, no wheezing, rales,rhonchi or crepitation. No use of accessory muscles of respiration.  CARDIOVASCULAR: S1, S2 normal. No murmurs, rubs, or gallops.  ABDOMEN: Soft, non-tender, non-distended.  EXTREMITIES: No pedal edema, cyanosis, or clubbing.  NEUROLOGIC: Cranial nerves II through XII are intact. Muscle strength 4+/5 in right  lower extremity and 5 out of 5 in upper extremities. Sensation decreased right side of face and right leg. Gait without a walker a little unsteady but had to hold onto steady himself. PSYCHIATRIC: The patient is alert and oriented x 3.  SKIN: No obvious rash, lesion, or ulcer.   DATA REVIEW:   CBC Recent Labs  Lab 07/11/21 0507  WBC 8.9  HGB 14.0  HCT 43.0  PLT 328    Chemistries  Recent Labs  Lab 07/07/21 1526 07/08/21 0211 07/11/21 0507  NA 140   < > 139  K 5.2*   < > 4.7  CL 97*   < > 105  CO2 31   < > 24  GLUCOSE 97   < > 141*  BUN 26*   < > 24*  CREATININE 1.13   < > 1.01  CALCIUM 9.8   < > 9.4  MG 2.4  --   --   AST 35  --   --   ALT 27  --   --   ALKPHOS 104  --   --   BILITOT 0.4  --   --    < > = values in this interval not displayed.     Microbiology Results  Results for orders placed or performed during the hospital encounter of 07/07/21  Blood culture (routine single)      Status: None (Preliminary result)   Collection Time: 07/07/21  3:26 PM   Specimen: BLOOD  Result Value Ref Range Status   Specimen Description BLOOD RIGHT ANTECUBITAL  Final   Special Requests   Final    BOTTLES DRAWN AEROBIC AND ANAEROBIC Blood Culture adequate volume   Culture   Final    NO GROWTH 4 DAYS Performed at [facility], [address], [place], [place] [address]    Report Status PENDING  Incomplete  Resp Panel by RT-PCR (Flu A&B, Covid) Nasopharyngeal Swab     Status: None   Collection Time: 07/07/21  4:51 PM   Specimen: Nasopharyngeal Swab; Nasopharyngeal(NP) swabs in vial transport medium  Result Value Ref Range Status   SARS Coronavirus 2 by RT PCR NEGATIVE NEGATIVE Final    Comment: (NOTE) SARS-CoV-2 target nucleic acids are NOT DETECTED.  The SARS-CoV-2 RNA is generally detectable in upper respiratory specimens during the acute phase of infection. The lowest concentration of SARS-CoV-2 viral copies this assay can detect is 138 copies/mL. A negative result does not preclude SARS-Cov-2 infection and should not be used as the sole basis for treatment or other patient management decisions. A negative result may occur with  improper specimen collection/handling, submission of specimen other than nasopharyngeal swab, presence of viral mutation(s) within the areas targeted by this assay, and inadequate number of viral copies(<138 copies/mL). A negative result must be combined with clinical observations, patient history, and epidemiological information. The expected result is Negative.  Fact Sheet for Patients:  BloggerCourse.com  Fact Sheet for Healthcare Providers:  SeriousBroker.it  This test is no t yet approved or cleared by the [place] FDA and  has been authorized for detection and/or diagnosis of SARS-CoV-2 by FDA under an Emergency Use Authorization (EUA). This EUA will remain  in effect  (meaning this test can be used) for the duration of the COVID-19 declaration under Section 564(b)(1) of the Act, 21 U.S.C.section 360bbb-3(b)(1), unless the authorization is terminated  or revoked sooner.       Influenza A by PCR NEGATIVE NEGATIVE Final   Influenza B  by PCR NEGATIVE NEGATIVE Final    Comment: (NOTE) The Xpert Xpress SARS-CoV-2/FLU/RSV plus assay is intended as an aid in the diagnosis of influenza from Nasopharyngeal swab specimens and should not be used as a sole basis for treatment. Nasal washings and aspirates are unacceptable for Xpert Xpress SARS-CoV-2/FLU/RSV testing.  Fact Sheet for Patients: BloggerCourse.com  Fact Sheet for Healthcare Providers: SeriousBroker.it  This test is not yet approved or cleared by the [place] FDA and has been authorized for detection and/or diagnosis of SARS-CoV-2 by FDA under an Emergency Use Authorization (EUA). This EUA will remain in effect (meaning this test can be used) for the duration of the COVID-19 declaration under Section 564(b)(1) of the Act, 21 U.S.C. section 360bbb-3(b)(1), unless the authorization is terminated or revoked.  Performed at [facility], [address] [place], [place] [address]   Respiratory (~20 pathogens) panel by PCR     Status: None   Collection Time: 07/07/21  5:24 PM   Specimen: Nasopharyngeal Swab; Respiratory  Result Value Ref Range Status   Adenovirus NOT DETECTED NOT DETECTED Final   Coronavirus 229E NOT DETECTED NOT DETECTED Final    Comment: (NOTE) The Coronavirus on the Respiratory Panel, DOES NOT test for the novel  Coronavirus (2019 nCoV)    Coronavirus HKU1 NOT DETECTED NOT DETECTED Final   Coronavirus NL63 NOT DETECTED NOT DETECTED Final   Coronavirus OC43 NOT DETECTED NOT DETECTED Final   Metapneumovirus NOT DETECTED NOT DETECTED Final   Rhinovirus / Enterovirus NOT DETECTED NOT DETECTED Final   Influenza A  NOT DETECTED NOT DETECTED Final   Influenza B NOT DETECTED NOT DETECTED Final   Parainfluenza Virus 1 NOT DETECTED NOT DETECTED Final   Parainfluenza Virus 2 NOT DETECTED NOT DETECTED Final   Parainfluenza Virus 3 NOT DETECTED NOT DETECTED Final   Parainfluenza Virus 4 NOT DETECTED NOT DETECTED Final   Respiratory Syncytial Virus NOT DETECTED NOT DETECTED Final   Bordetella pertussis NOT DETECTED NOT DETECTED Final   Bordetella Parapertussis NOT DETECTED NOT DETECTED Final   Chlamydophila pneumoniae NOT DETECTED NOT DETECTED Final   Mycoplasma pneumoniae NOT DETECTED NOT DETECTED Final    Comment: Performed at [facility], [address] [place], [place] [address]  MRSA Next Gen by PCR, Nasal     Status: Abnormal   Collection Time: 07/07/21  8:31 PM   Specimen: Nasal Mucosa; Nasal Swab  Result Value Ref Range Status   MRSA by PCR Next Gen DETECTED (A) NOT DETECTED Final    Comment: Chaney Malling @2146  on 07/07/21 skl (NOTE) The GeneXpert MRSA Assay (FDA approved for NASAL specimens only), is one component of a comprehensive MRSA colonization surveillance program. It is not intended to diagnose MRSA infection nor to guide or monitor treatment for MRSA infections. Test performance is not FDA approved in patients less than 19 years old. Performed at [facility], [address] [place], [place] [address]   Culture, blood (single)     Status: None (Preliminary result)   Collection Time: 07/07/21 10:00 PM   Specimen: BLOOD  Result Value Ref Range Status   Specimen Description BLOOD LEFT FOREARM  Final   Special Requests   Final    BOTTLES DRAWN AEROBIC AND ANAEROBIC Blood Culture adequate volume   Culture   Final    NO GROWTH 4 DAYS Performed at [facility], [address], [place], [place] [address]    Report Status PENDING  Incomplete  Management plans discussed with the patient, family and they are in agreement.  CODE STATUS:     Code  Status Orders  (From admission, onward)           Start     Ordered   07/07/21 1716  Full code  Continuous        07/07/21 1717           Code Status History     Date Active Date Inactive Code Status Order ID Comments User Context   12/23/2019 0605 12/25/2019 2326 Full Code [id number]  Campbell Lerner, MD ED   11/16/2017 0006 11/20/2017 1608 Full Code [id number]  Audery Amel, MD Inpatient   11/15/2017 1853 11/15/2017 2323 Full Code [id number]  Myrna Blazer, MD ED       TOTAL TIME TAKING CARE OF THIS PATIENT: 35 minutes.    Alford Highland M.D on 07/11/2021 at 3:49 PM   [facility]  CC: Primary care physician; [facility]

## 2021-07-11 NOTE — Plan of Care (Signed)
Pt alert and oriented x 4. Tylenol given x 1 this shift for shoulder pain. Ad lib with steady gait. No neuro deficits noted.  Problem: Education: Goal: Knowledge of General Education information will improve Description: Including pain rating scale, medication(s)/side effects and non-pharmacologic comfort measures Outcome: Progressing   Problem: Health Behavior/Discharge Planning: Goal: Ability to manage health-related needs will improve Outcome: Progressing   Problem: Clinical Measurements: Goal: Ability to maintain clinical measurements within normal limits will improve Outcome: Progressing Goal: Will remain free from infection Outcome: Progressing Goal: Diagnostic test results will improve Outcome: Progressing Goal: Respiratory complications will improve Outcome: Progressing Goal: Cardiovascular complication will be avoided Outcome: Progressing   Problem: Activity: Goal: Risk for activity intolerance will decrease Outcome: Progressing   Problem: Nutrition: Goal: Adequate nutrition will be maintained Outcome: Progressing   Problem: Coping: Goal: Level of anxiety will decrease Outcome: Progressing   Problem: Elimination: Goal: Will not experience complications related to bowel motility Outcome: Progressing Goal: Will not experience complications related to urinary retention Outcome: Progressing   Problem: Pain Managment: Goal: General experience of comfort will improve Outcome: Progressing   Problem: Safety: Goal: Ability to remain free from injury will improve Outcome: Progressing   Problem: Skin Integrity: Goal: Risk for impaired skin integrity will decrease Outcome: Progressing

## 2021-07-11 NOTE — Progress Notes (Signed)
Occupational Therapy * Physical Therapy * Speech Therapy          DATE  07/11/21 PATIENT NAME Bill Villanueva PATIENT MRN [id number] DIAGNOSIS/DIAGNOSIS CODE G93.40, R53.1, A41.9, R65.20 DATE OF DISCHARGE 07/11/21 PRIMARY CARE PHYSICIAN PHONE/FAX [facility] [phone]      Dear Provider Name: [facility] Fax: [phone]  I certify that I have examined this patient and that occupational/physical/speech therapy is necessary on an outpatient basis.    The patient has expressed interest in completing their recommended course of therapy at your location.  Once a formal order from the patient's primary care physician has been obtained, please  contact him/her to schedule an appointment for evaluation at your earliest convenience.   [ X ]  Physical Therapy Evaluate and Treat          [ X ]  Occupational Therapy Evaluate and Treat                                     [  ]  Speech Therapy Evaluate and Treat     The patient's primary care physician (listed above) must furnish and be responsible for a formal order such that the recommended services may be furnished while under the primary physician's care, and that the plan of care will be established and reviewed every 30 days (or more often if condition necessitates).

## 2021-07-11 NOTE — TOC Transition Note (Addendum)
Transition of Care [facility]) - CM/SW Discharge Note   Patient Details  Name: Bill Villanueva MRN: [id number] Date of Birth: January 23, 1966  [facility] CM/SW Contact:  Liliana Cline, LCSW Phone Number: 07/11/2021, 1:07 PM   Clinical Narrative:   Per MD patient medically ready for DC home with [facility]. CSW unable to establish [facility] coverage after contacting 8 agencies (listed in previous note).  Informed MD options of SNF or Outpatient Rehab.   Outpatient PT/OT referral faxed to [facility]. Contact info printed for patient.   RW ordered through [employer] to be delivered to bedside prior to DC.   No additional TOC needs.   3:30- Call to [employer] to check status of RW delivery. Spoke to [place]. She stated she will call the delivery driver to make sure he is on the way and is aware patient needs his RW asap.    Final next level of care: OP Rehab Barriers to Discharge: Barriers Resolved   Patient Goals and CMS Choice Patient states their goals for this hospitalization and ongoing recovery are:: prefers HH, ok with SNF if HH cannot be established. CMS Medicare.gov Compare Post Acute Care list provided to:: Patient Choice offered to / list presented to : Patient  Discharge Placement                       Discharge Plan and Services                DME Arranged: Walker rolling DME Agency: [employer] Date DME Agency Contacted: 07/11/21                Social Determinants of Health (SDOH) Interventions     Readmission Risk Interventions Readmission Risk Prevention Plan 07/10/2021  Post Dischage Appt Complete  Medication Screening Complete  Transportation Screening Complete  Some recent data might be hidden

## 2021-07-11 NOTE — Discharge Instructions (Signed)
No driving with right leg weakness.

## 2021-07-12 LAB — CULTURE, BLOOD (SINGLE)
Culture: NO GROWTH
Culture: NO GROWTH
Special Requests: ADEQUATE
Special Requests: ADEQUATE

## 2021-07-21 ENCOUNTER — Encounter: Payer: Self-pay | Admitting: Neurology

## 2021-07-21 ENCOUNTER — Ambulatory Visit: Payer: Medicaid Other | Admitting: Neurology

## 2021-08-30 LAB — BLOOD GAS, VENOUS
Acid-Base Excess: 3.5 mmol/L — ABNORMAL HIGH (ref 0.0–2.0)
Acid-base deficit: 2.4 mmol/L — ABNORMAL HIGH (ref 0.0–2.0)
Bicarbonate: 30.8 mmol/L — ABNORMAL HIGH (ref 20.0–28.0)
Bicarbonate: 31.2 mmol/L — ABNORMAL HIGH (ref 20.0–28.0)
O2 Saturation: 13 %
O2 Saturation: 41 %
Patient temperature: 37
Patient temperature: 37
pCO2, Ven: 104 mmHg (ref 44.0–60.0)
pCO2, Ven: 62 mmHg — ABNORMAL HIGH (ref 44.0–60.0)
pH, Ven: 7.08 — CL (ref 7.250–7.430)
pH, Ven: 7.31 (ref 7.250–7.430)

## 2022-08-19 ENCOUNTER — Other Ambulatory Visit: Payer: Self-pay | Admitting: Family Medicine

## 2022-08-19 ENCOUNTER — Ambulatory Visit
Admission: RE | Admit: 2022-08-19 | Discharge: 2022-08-19 | Disposition: A | Discharge status: Home / Self Care | Payer: Medicaid Other | Attending: Family Medicine | Admitting: Family Medicine

## 2022-08-19 ENCOUNTER — Ambulatory Visit: Payer: Medicaid Other

## 2022-08-19 ENCOUNTER — Ambulatory Visit
Admission: RE | Admit: 2022-08-19 | Discharge: 2022-08-19 | Disposition: A | Discharge status: Home / Self Care | Payer: Medicaid Other | Source: Ambulatory Visit | Attending: Family Medicine | Admitting: Family Medicine

## 2022-08-19 DIAGNOSIS — R0781 Pleurodynia: Secondary | ICD-10-CM

## 2023-01-06 ENCOUNTER — Emergency Department
Admission: EM | Admit: 2023-01-06 | Discharge: 2023-01-06 | Disposition: A | Discharge status: Home / Self Care | Payer: 59 | Attending: Emergency Medicine | Admitting: Emergency Medicine

## 2023-01-06 ENCOUNTER — Emergency Department: Payer: 59

## 2023-01-06 ENCOUNTER — Other Ambulatory Visit: Payer: Self-pay

## 2023-01-06 DIAGNOSIS — S8991XA Unspecified injury of right lower leg, initial encounter: Secondary | ICD-10-CM | POA: Diagnosis not present

## 2023-01-06 DIAGNOSIS — M79642 Pain in left hand: Secondary | ICD-10-CM | POA: Diagnosis not present

## 2023-01-06 DIAGNOSIS — S20212A Contusion of left front wall of thorax, initial encounter: Secondary | ICD-10-CM | POA: Insufficient documentation

## 2023-01-06 DIAGNOSIS — M79661 Pain in right lower leg: Secondary | ICD-10-CM | POA: Insufficient documentation

## 2023-01-06 DIAGNOSIS — D649 Anemia, unspecified: Secondary | ICD-10-CM | POA: Diagnosis not present

## 2023-01-06 DIAGNOSIS — R609 Edema, unspecified: Secondary | ICD-10-CM | POA: Diagnosis not present

## 2023-01-06 DIAGNOSIS — M79604 Pain in right leg: Secondary | ICD-10-CM | POA: Diagnosis not present

## 2023-01-06 DIAGNOSIS — M542 Cervicalgia: Secondary | ICD-10-CM | POA: Diagnosis not present

## 2023-01-06 DIAGNOSIS — Y92410 Unspecified street and highway as the place of occurrence of the external cause: Secondary | ICD-10-CM | POA: Diagnosis not present

## 2023-01-06 DIAGNOSIS — I10 Essential (primary) hypertension: Secondary | ICD-10-CM | POA: Diagnosis not present

## 2023-01-06 DIAGNOSIS — R0789 Other chest pain: Secondary | ICD-10-CM | POA: Diagnosis not present

## 2023-01-06 DIAGNOSIS — Z041 Encounter for examination and observation following transport accident: Secondary | ICD-10-CM | POA: Diagnosis not present

## 2023-01-06 DIAGNOSIS — M549 Dorsalgia, unspecified: Secondary | ICD-10-CM | POA: Diagnosis not present

## 2023-01-06 LAB — CBC WITH DIFFERENTIAL/PLATELET
Abs Immature Granulocytes: 0.02 10*3/uL (ref 0.00–0.07)
Basophils Absolute: 0.1 10*3/uL (ref 0.0–0.1)
Basophils Relative: 1 %
Eosinophils Absolute: 0.2 10*3/uL (ref 0.0–0.5)
Eosinophils Relative: 2 %
HCT: 38.4 % — ABNORMAL LOW (ref 39.0–52.0)
Hemoglobin: 12.6 g/dL — ABNORMAL LOW (ref 13.0–17.0)
Immature Granulocytes: 0 %
Lymphocytes Relative: 25 %
Lymphs Abs: 2.1 10*3/uL (ref 0.7–4.0)
MCH: 32.2 pg (ref 26.0–34.0)
MCHC: 32.8 g/dL (ref 30.0–36.0)
MCV: 98.2 fL (ref 80.0–100.0)
Monocytes Absolute: 0.7 10*3/uL (ref 0.1–1.0)
Monocytes Relative: 8 %
Neutro Abs: 5.1 10*3/uL (ref 1.7–7.7)
Neutrophils Relative %: 64 %
Platelets: 356 10*3/uL (ref 150–400)
RBC: 3.91 MIL/uL — ABNORMAL LOW (ref 4.22–5.81)
RDW: 13.1 % (ref 11.5–15.5)
WBC: 8.2 10*3/uL (ref 4.0–10.5)
nRBC: 0 % (ref 0.0–0.2)

## 2023-01-06 LAB — ETHANOL: Alcohol, Ethyl (B): 37 mg/dL — ABNORMAL HIGH (ref <10)

## 2023-01-06 LAB — TROPONIN I (HIGH SENSITIVITY): Troponin I (High Sensitivity): 4 ng/L (ref <18)

## 2023-01-06 LAB — COMPREHENSIVE METABOLIC PANEL
ALT: 14 U/L (ref 0–44)
AST: 20 U/L (ref 15–41)
Albumin: 3.1 g/dL — ABNORMAL LOW (ref 3.5–5.0)
Alkaline Phosphatase: 52 U/L (ref 38–126)
Anion gap: 10 (ref 5–15)
BUN: 15 mg/dL (ref 6–20)
CO2: 23 mmol/L (ref 22–32)
Calcium: 7.8 mg/dL — ABNORMAL LOW (ref 8.9–10.3)
Chloride: 109 mmol/L (ref 98–111)
Creatinine, Ser: 1.17 mg/dL (ref 0.61–1.24)
GFR, Estimated: >60 mL/min (ref >60)
Glucose, Bld: 73 mg/dL (ref 70–99)
Potassium: 3.4 mmol/L — ABNORMAL LOW (ref 3.5–5.1)
Sodium: 142 mmol/L (ref 135–145)
Total Bilirubin: 0.4 mg/dL (ref 0.3–1.2)
Total Protein: 5.2 g/dL — ABNORMAL LOW (ref 6.5–8.1)

## 2023-01-06 LAB — LIPASE, BLOOD: Lipase: 43 U/L (ref 11–51)

## 2023-01-06 LAB — TYPE AND SCREEN
ABO/RH(D): O POS
Antibody Screen: NEGATIVE

## 2023-01-06 MED ORDER — IOHEXOL 300 MG/ML  SOLN
100.0000 mL | Freq: Once | INTRAMUSCULAR | Status: AC | PRN
  Administered 2023-01-06: 100 mL via INTRAVENOUS

## 2023-01-06 MED ORDER — ONDANSETRON 4 MG PO TBDP: 4.0000 mg | ORAL_TABLET | Freq: Three times a day (TID) | ORAL | 0 refills | Status: AC | PRN

## 2023-01-06 MED ORDER — MORPHINE SULFATE (PF) 4 MG/ML IV SOLN
4.0000 mg | Freq: Once | INTRAVENOUS | Status: AC
  Administered 2023-01-06: 4 mg via INTRAVENOUS
  Filled 2023-01-06: qty 1

## 2023-01-06 MED ORDER — ONDANSETRON 4 MG PO TBDP
4.0000 mg | ORAL_TABLET | Freq: Three times a day (TID) | ORAL | 0 refills | Status: DC | PRN
  Filled 2023-01-06: qty 12, 4d supply, fill #0

## 2023-01-06 MED ORDER — ONDANSETRON HCL 4 MG/2ML IJ SOLN
4.0000 mg | Freq: Once | INTRAMUSCULAR | Status: AC
  Administered 2023-01-06: 4 mg via INTRAVENOUS
  Filled 2023-01-06: qty 2

## 2023-01-06 MED ORDER — HYDROCODONE-ACETAMINOPHEN 5-325 MG PO TABS
1.0000 | ORAL_TABLET | Freq: Four times a day (QID) | ORAL | 0 refills | Status: DC | PRN
  Filled 2023-01-06: qty 12, 2d supply, fill #0

## 2023-01-06 MED ORDER — HYDROCODONE-ACETAMINOPHEN 5-325 MG PO TABS: 1.0000 | ORAL_TABLET | Freq: Four times a day (QID) | ORAL | 0 refills | Status: AC | PRN
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
## 2023-01-08 ENCOUNTER — Other Ambulatory Visit: Payer: Self-pay
